# Patient Record
Sex: Male | Born: 1955
Health system: Southern US, Community
[De-identification: ages and names within clinical notes are randomized; demographics above are authoritative.]

## PROBLEM LIST (undated history)

## (undated) DIAGNOSIS — M1990 Unspecified osteoarthritis, unspecified site: Secondary | ICD-10-CM

## (undated) DIAGNOSIS — I10 Essential (primary) hypertension: Secondary | ICD-10-CM

## (undated) DIAGNOSIS — R7303 Prediabetes: Secondary | ICD-10-CM

## (undated) HISTORY — PX: TONSILLECTOMY: SUR1361

## (undated) HISTORY — PX: OTHER SURGICAL HISTORY: SHX169

---

## 2008-05-18 ENCOUNTER — Ambulatory Visit (HOSPITAL_BASED_OUTPATIENT_CLINIC_OR_DEPARTMENT_OTHER): Admission: RE | Admit: 2008-05-18 | Discharge: 2008-05-18 | Payer: Self-pay | Admitting: Family Medicine

## 2008-05-21 ENCOUNTER — Ambulatory Visit: Payer: Self-pay | Admitting: Internal Medicine

## 2011-04-02 ENCOUNTER — Emergency Department (HOSPITAL_BASED_OUTPATIENT_CLINIC_OR_DEPARTMENT_OTHER)
Admission: EM | Admit: 2011-04-02 | Discharge: 2011-04-02 | Disposition: A | Discharge status: Home / Self Care | Payer: 59 | Attending: Emergency Medicine | Admitting: Emergency Medicine

## 2011-04-02 ENCOUNTER — Emergency Department (INDEPENDENT_AMBULATORY_CARE_PROVIDER_SITE_OTHER): Payer: 59

## 2011-04-02 ENCOUNTER — Emergency Department (HOSPITAL_BASED_OUTPATIENT_CLINIC_OR_DEPARTMENT_OTHER): Payer: 59

## 2011-04-02 DIAGNOSIS — W230XXA Caught, crushed, jammed, or pinched between moving objects, initial encounter: Secondary | ICD-10-CM | POA: Insufficient documentation

## 2011-04-02 DIAGNOSIS — IMO0002 Reserved for concepts with insufficient information to code with codable children: Secondary | ICD-10-CM

## 2011-04-02 DIAGNOSIS — Y92009 Unspecified place in unspecified non-institutional (private) residence as the place of occurrence of the external cause: Secondary | ICD-10-CM | POA: Insufficient documentation

## 2011-04-02 DIAGNOSIS — S82109A Unspecified fracture of upper end of unspecified tibia, initial encounter for closed fracture: Secondary | ICD-10-CM

## 2011-04-09 ENCOUNTER — Ambulatory Visit (HOSPITAL_COMMUNITY): Payer: 59

## 2011-04-09 ENCOUNTER — Inpatient Hospital Stay (HOSPITAL_COMMUNITY)
Admission: RE | Admit: 2011-04-09 | Discharge: 2011-04-12 | DRG: 489 | Disposition: A | Discharge status: Home Health | Payer: 59 | Source: Ambulatory Visit | Attending: Orthopedic Surgery | Admitting: Orthopedic Surgery

## 2011-04-09 DIAGNOSIS — Y92009 Unspecified place in unspecified non-institutional (private) residence as the place of occurrence of the external cause: Secondary | ICD-10-CM

## 2011-04-09 DIAGNOSIS — S83289A Other tear of lateral meniscus, current injury, unspecified knee, initial encounter: Secondary | ICD-10-CM | POA: Diagnosis present

## 2011-04-09 DIAGNOSIS — S82109A Unspecified fracture of upper end of unspecified tibia, initial encounter for closed fracture: Principal | ICD-10-CM | POA: Diagnosis present

## 2011-04-09 LAB — URINALYSIS, ROUTINE W REFLEX MICROSCOPIC
Bilirubin Urine: NEGATIVE
Hgb urine dipstick: NEGATIVE
Protein, ur: NEGATIVE mg/dL
Urobilinogen, UA: 1 mg/dL (ref 0.0–1.0)

## 2011-04-09 LAB — COMPREHENSIVE METABOLIC PANEL
CO2: 26 mEq/L (ref 19–32)
Calcium: 9.1 mg/dL (ref 8.4–10.5)
Creatinine, Ser: 0.96 mg/dL (ref 0.4–1.5)
GFR calc Af Amer: >60 mL/min (ref >60)
GFR calc non Af Amer: >60 mL/min (ref >60)
Glucose, Bld: 124 mg/dL — ABNORMAL HIGH (ref 70–99)

## 2011-04-09 LAB — SURGICAL PCR SCREEN: Staphylococcus aureus: NEGATIVE

## 2011-04-09 LAB — APTT: aPTT: 30 seconds (ref 24–37)

## 2011-04-09 LAB — CBC
HCT: 37.1 % — ABNORMAL LOW (ref 39.0–52.0)
MCHC: 35.6 g/dL (ref 30.0–36.0)
RDW: 12.3 % (ref 11.5–15.5)

## 2011-04-09 LAB — PROTIME-INR
INR: 1.04 (ref 0.00–1.49)
Prothrombin Time: 13.8 seconds (ref 11.6–15.2)

## 2011-04-11 LAB — PROTIME-INR
INR: 2.14 — ABNORMAL HIGH (ref 0.00–1.49)
Prothrombin Time: 24.1 seconds — ABNORMAL HIGH (ref 11.6–15.2)

## 2011-04-11 LAB — HEMOGLOBIN AND HEMATOCRIT, BLOOD: Hemoglobin: 12.8 g/dL — ABNORMAL LOW (ref 13.0–17.0)

## 2011-04-11 NOTE — Op Note (Signed)
Todd Herrera, Todd Herrera                ACCOUNT NO.:  [id number]  MEDICAL RECORD NO.:  [id number]           PATIENT TYPE:  O  LOCATION:  DAYL                         FACILITY:  [facility]  PHYSICIAN:  Georges Lynch. Retta Pitcher, M.D.DATE OF BIRTH:  12/13/1956  DATE OF PROCEDURE:  04/09/2011 DATE OF DISCHARGE:                              OPERATIVE REPORT   SURGEON:  Georges Lynch. Darrelyn Hillock, M.D.  ASSISTANT:  Rozell Searing, [facility].  PREOPERATIVE DIAGNOSIS:  Severe closed depressed lateral tibial plateau fracture of the right knee with disruption of his lateral meniscus.  POSTOPERATIVE DIAGNOSIS:  Severe closed depressed lateral tibial plateau fracture of the right knee with disruption of his lateral meniscus.  OPERATION: 1. Open reduction, internal plate fixation of a complex comminuted     lateral tibial plateau fracture. 2. Repair of the lateral meniscus. 3. Bone graft utilizing ETEX bone graft material.  We utilized about     10 cc of that.  The plate used was a DePuy locking plate, standard     3 hole. 4. Arthrotomy, right knee.  PROCEDURE:  Under general anesthesia, routine orthopedic prepping and draping of the right lower extremity was carried out.  The appropriate time-out was carried out prior to making incision.  Prior to that, in the holding area, I marked the appropriate right leg.  At that time, leg was exsanguinated with an Esmarch.  Tourniquet was elevated at 325 mmHg. A hockey-stick type incision was made over the lateral plateau region. A flap was created with the subcutaneous tissue and reflected.  At this time, I incised the iliotibial band, stripped that from the lateral tibia and went into the joint.  We did an arthrotomy.  I then went in and noticed the meniscus was detached.  I had to tag that for future repair.  Following that, there was a large fragment laterally off the lateral tibial plateau.  I went in with the curette, curetted out an area of cancellus bone and then  utilized a Cobb elevator and elevated the plateau to its normal level.  We had a nice reduction.  Following that, I packed the subchondral area with ETEX bone graft material 10 cc. I then reapproximated the large flap of tibial condyle.  Following that, I then applied my standard 3-hole plate in the usual fashion.  The plate was held in place with a guide wire.  Note, initial guide wire was placed in the bone in this area mainly for guidance purposes and to make sure we had a complete parallel plane to the joint.  Following that, the appropriate drill holes were made in the plate.  We utilized locking screws in usual fashion to affix the plate to the tibia and to the condyle of the tibia.  Following that, I thoroughly irrigated out the area.  We had a nice reduction of the plateau.  I then repaired the lateral meniscus and closed the wound layers in usual fashion. Skin was closed with metal staples.  I injected 30 cc of 0.25% Marcaine into the knee and sterile Neosporin dressing was applied.  The patient had 2 grams IV Ancef  preop.  Postop, he will be maintained on the Coumadin and heparin protocol.          ______________________________ Georges Lynch. Darrelyn Hillock, M.D.     RAG/MEDQ  D:  04/09/2011  T:  04/09/2011  Job:  [id number]  Electronically Signed by Ranee Gosselin M.D. on 04/11/2011 08:06:37 AM

## 2011-04-12 LAB — PROTIME-INR
INR: 1.84 — ABNORMAL HIGH (ref 0.00–1.49)
Prothrombin Time: 21.4 seconds — ABNORMAL HIGH (ref 11.6–15.2)

## 2011-05-07 NOTE — Procedures (Signed)
Todd Herrera, Todd Herrera                ACCOUNT NO.:  [id number]   MEDICAL RECORD NO.:  [id number]          PATIENT TYPE:  OUT   LOCATION:  SLEEP CENTER                 FACILITY:  [facility]   PHYSICIAN:  Clinton D. Maple Hudson, MD, FCCP, FACPDATE OF BIRTH:  05-25-1956   DATE OF STUDY:  05/18/2008                            NOCTURNAL POLYSOMNOGRAM   REFERRING PHYSICIAN:  Rafaela M. Aguiar   INDICATION FOR STUDY:  Hypersomnia with sleep apnea.   EPWORTH SLEEPINESS SCORE:  8/24.  BMI 27.6, weight 195 pounds, height  70.5 inches, neck 17 inches.   MEDICATIONS:  No home medications were listed.   SLEEP ARCHITECTURE:  Total sleep time 256 minutes with sleep efficiency  60.4%.  Stage I was 5.3%, stage II 76%, stage III absent, REM 18.1% of  total sleep time.  Sleep latency 15 minutes, REM latency 234 minutes.  Awake after sleep onset 153 minutes, arousal index 16.8.  No bedtime  medication was taken.  Brief intervals of sleep were recorded at 10:30  p.m. and at midnight, but sustained sleep was not achieved until 1:45  a.m.   RESPIRATORY DATA:  Apnea/hypopnea index (AHI) 3.7 per hour, respiratory  disturbance index (RDI) 11.5 per hour.  The AHI is within normal.  The  RDI would indicate mild obstructive sleep apnea/hypopnea syndrome.  A  total of 16 events were scored, including 11 obstructive apneas and 5  hypopneas.  They were insufficient effects to permit CPAP titration by  split-protocol.  Most events were associated with supine sleep position  while in REM with REM AHI 18.1 per hour.   OXYGEN DATA:  Moderately loud snoring with oxygen desaturation to a  nadir of 91%.  Mean oxygen saturation through the study 97% on room air.   CARDIAC DATA:  Normal sinus rhythm.   MOVEMENT-PARASOMNIA:  No significant movement disturbance.  Bathroom x1.   IMPRESSIONS-RECOMMENDATIONS:  1. Mild obstructive sleep apnea/hypopnea syndrome, AHI 3.7 per hour,      RDI 11.5 per hour.  Most events were recorded  while supine in REM      as expected.  Moderately loud snoring with oxygen desaturation to a      nadir of 91%.  2. He had insufficient events to qualify for CPAP titration by split-      protocol and sleep onset was delayed.  Based      on his RDI, he could qualify for a trial of CPAP titration if      clinically appropriate.  Consider return for CPAP titration or      evaluate for alternative therapies as indicated.      Clinton D. Maple Hudson, MD, [facility], FACP  Diplomate, Biomedical engineer of Sleep Medicine  Electronically Signed     CDY/MEDQ  D:  05/21/2008 13:50:26  T:  05/21/2008 14:16:50  Job:  [id number]

## 2011-06-10 ENCOUNTER — Ambulatory Visit: Payer: 59 | Attending: Orthopedic Surgery | Admitting: Physical Therapy

## 2011-06-10 DIAGNOSIS — M25569 Pain in unspecified knee: Secondary | ICD-10-CM | POA: Insufficient documentation

## 2011-06-10 DIAGNOSIS — R269 Unspecified abnormalities of gait and mobility: Secondary | ICD-10-CM | POA: Insufficient documentation

## 2011-06-10 DIAGNOSIS — R262 Difficulty in walking, not elsewhere classified: Secondary | ICD-10-CM | POA: Insufficient documentation

## 2011-06-10 DIAGNOSIS — IMO0001 Reserved for inherently not codable concepts without codable children: Secondary | ICD-10-CM | POA: Insufficient documentation

## 2011-06-19 ENCOUNTER — Ambulatory Visit: Payer: 59 | Admitting: Rehabilitation

## 2011-06-20 ENCOUNTER — Encounter: Payer: 59 | Admitting: Rehabilitation

## 2011-06-25 ENCOUNTER — Ambulatory Visit: Payer: 59 | Attending: Diagnostic Radiology | Admitting: Rehabilitation

## 2011-06-25 DIAGNOSIS — IMO0001 Reserved for inherently not codable concepts without codable children: Secondary | ICD-10-CM | POA: Insufficient documentation

## 2011-06-25 DIAGNOSIS — M25569 Pain in unspecified knee: Secondary | ICD-10-CM | POA: Insufficient documentation

## 2011-06-25 DIAGNOSIS — R269 Unspecified abnormalities of gait and mobility: Secondary | ICD-10-CM | POA: Insufficient documentation

## 2011-06-25 DIAGNOSIS — R262 Difficulty in walking, not elsewhere classified: Secondary | ICD-10-CM | POA: Insufficient documentation

## 2011-06-27 ENCOUNTER — Ambulatory Visit: Payer: 59 | Admitting: Rehabilitation

## 2011-07-10 ENCOUNTER — Ambulatory Visit: Payer: 59 | Admitting: Rehabilitation

## 2011-07-17 ENCOUNTER — Ambulatory Visit: Payer: 59 | Admitting: Physical Therapy

## 2011-07-18 ENCOUNTER — Ambulatory Visit: Payer: 59 | Admitting: Physical Therapy

## 2011-07-22 ENCOUNTER — Ambulatory Visit: Payer: 59 | Admitting: Rehabilitation

## 2011-07-24 ENCOUNTER — Ambulatory Visit: Payer: 59 | Attending: Orthopedic Surgery | Admitting: Rehabilitation

## 2011-07-24 DIAGNOSIS — M25569 Pain in unspecified knee: Secondary | ICD-10-CM | POA: Insufficient documentation

## 2011-07-24 DIAGNOSIS — R262 Difficulty in walking, not elsewhere classified: Secondary | ICD-10-CM | POA: Insufficient documentation

## 2011-07-24 DIAGNOSIS — R269 Unspecified abnormalities of gait and mobility: Secondary | ICD-10-CM | POA: Insufficient documentation

## 2011-07-24 DIAGNOSIS — IMO0001 Reserved for inherently not codable concepts without codable children: Secondary | ICD-10-CM | POA: Insufficient documentation

## 2011-07-30 ENCOUNTER — Ambulatory Visit: Payer: 59 | Admitting: Rehabilitation

## 2011-08-01 ENCOUNTER — Ambulatory Visit: Payer: 59 | Admitting: Rehabilitation

## 2011-08-06 ENCOUNTER — Ambulatory Visit: Payer: 59 | Admitting: Rehabilitation

## 2011-08-08 ENCOUNTER — Ambulatory Visit: Payer: 59 | Admitting: Rehabilitation

## 2011-08-12 ENCOUNTER — Ambulatory Visit: Payer: 59 | Admitting: Rehabilitation

## 2011-08-14 ENCOUNTER — Ambulatory Visit: Payer: 59 | Admitting: Rehabilitation

## 2011-08-21 ENCOUNTER — Ambulatory Visit: Payer: 59 | Admitting: Rehabilitation

## 2011-08-27 ENCOUNTER — Ambulatory Visit: Payer: 59 | Attending: Orthopedic Surgery | Admitting: Rehabilitation

## 2011-08-27 DIAGNOSIS — M25569 Pain in unspecified knee: Secondary | ICD-10-CM | POA: Insufficient documentation

## 2011-08-27 DIAGNOSIS — IMO0001 Reserved for inherently not codable concepts without codable children: Secondary | ICD-10-CM | POA: Insufficient documentation

## 2011-08-27 DIAGNOSIS — R262 Difficulty in walking, not elsewhere classified: Secondary | ICD-10-CM | POA: Insufficient documentation

## 2011-08-27 DIAGNOSIS — R269 Unspecified abnormalities of gait and mobility: Secondary | ICD-10-CM | POA: Insufficient documentation

## 2011-08-29 ENCOUNTER — Ambulatory Visit: Payer: 59 | Admitting: Rehabilitation

## 2011-09-03 ENCOUNTER — Ambulatory Visit: Payer: 59 | Admitting: Rehabilitation

## 2011-09-10 ENCOUNTER — Encounter: Payer: 59 | Admitting: Physical Therapy

## 2011-09-12 ENCOUNTER — Ambulatory Visit: Payer: 59 | Admitting: Physical Therapy

## 2011-09-17 ENCOUNTER — Ambulatory Visit: Payer: 59 | Admitting: Physical Therapy

## 2011-09-19 ENCOUNTER — Ambulatory Visit: Payer: 59 | Admitting: Physical Therapy

## 2011-10-02 ENCOUNTER — Ambulatory Visit: Payer: 59 | Admitting: Physical Therapy

## 2011-10-08 ENCOUNTER — Ambulatory Visit: Payer: 59 | Attending: Orthopedic Surgery | Admitting: Physical Therapy

## 2011-10-08 DIAGNOSIS — R262 Difficulty in walking, not elsewhere classified: Secondary | ICD-10-CM | POA: Insufficient documentation

## 2011-10-08 DIAGNOSIS — IMO0001 Reserved for inherently not codable concepts without codable children: Secondary | ICD-10-CM | POA: Insufficient documentation

## 2011-10-08 DIAGNOSIS — M25569 Pain in unspecified knee: Secondary | ICD-10-CM | POA: Insufficient documentation

## 2011-10-08 DIAGNOSIS — R269 Unspecified abnormalities of gait and mobility: Secondary | ICD-10-CM | POA: Insufficient documentation

## 2013-11-04 ENCOUNTER — Emergency Department (HOSPITAL_BASED_OUTPATIENT_CLINIC_OR_DEPARTMENT_OTHER)
Admission: EM | Admit: 2013-11-04 | Discharge: 2013-11-04 | Disposition: A | Discharge status: Home / Self Care | Payer: 59 | Attending: Emergency Medicine | Admitting: Emergency Medicine

## 2013-11-04 ENCOUNTER — Encounter (HOSPITAL_BASED_OUTPATIENT_CLINIC_OR_DEPARTMENT_OTHER): Payer: Self-pay | Admitting: Emergency Medicine

## 2013-11-04 ENCOUNTER — Emergency Department (HOSPITAL_BASED_OUTPATIENT_CLINIC_OR_DEPARTMENT_OTHER): Payer: 59

## 2013-11-04 DIAGNOSIS — R519 Headache, unspecified: Secondary | ICD-10-CM

## 2013-11-04 DIAGNOSIS — R51 Headache: Secondary | ICD-10-CM | POA: Insufficient documentation

## 2013-11-04 NOTE — ED Provider Notes (Signed)
CSN: [id number]     Arrival date & time 11/04/13  1200 History   First MD Initiated Contact with Patient 11/04/13 1220     Chief Complaint  Patient presents with  . Headache   (Consider location/radiation/quality/duration/timing/severity/associated sxs/prior Treatment) HPI Comments: Patient is a 57 year old otherwise healthy male presents to the emergency department with complaints of headache. He states that this started 5 days ago and gradually worsened. He had been taking some aspirin with some relief. He called his primary Dr. and was told to come here for evaluation and scan of his head. Patient tells me he is actually feeling better and his headache is gone. He is concerned that he may have an aneurysm.  Patient is a 57 y.o. male presenting with headaches. The history is provided by the patient.  Headache Pain location:  Frontal Quality:  Stabbing Radiates to:  Does not radiate Onset quality:  Gradual Duration:  5 days Timing:  Constant Progression:  Resolved Chronicity:  New Context: caffeine   Context: not activity and not exposure to bright light   Relieved by:  Nothing Worsened by:  Nothing tried Ineffective treatments:  None tried   History reviewed. No pertinent past medical history. Past Surgical History  Procedure Laterality Date  . Leg surgery     No family history on file. History  Substance Use Topics  . Smoking status: Never Smoker   . Smokeless tobacco: Not on file  . Alcohol Use: Yes    Review of Systems  Neurological: Positive for headaches.  All other systems reviewed and are negative.    Allergies  Review of patient's allergies indicates no known allergies.  Home Medications  No current outpatient prescriptions on file. BP 131/82  Pulse 64  Temp(Src) 98.5 F (36.9 C) (Oral)  Resp 16  Ht 5' 10.5" (1.791 m)  Wt 215 lb (97.523 kg)  BMI 30.40 kg/m2  SpO2 100% Physical Exam  Nursing note and vitals reviewed. Constitutional: He is  oriented to person, place, and time. He appears well-developed and well-nourished. No distress.  HENT:  Head: Normocephalic and atraumatic.  Mouth/Throat: Oropharynx is clear and moist.  Eyes: EOM are normal. Pupils are equal, round, and reactive to light.  Neck: Normal range of motion. Neck supple.  Cardiovascular: Normal rate, regular rhythm and normal heart sounds.   No murmur heard. Pulmonary/Chest: Effort normal and breath sounds normal. No respiratory distress. He has no wheezes.  Abdominal: Soft. Bowel sounds are normal. He exhibits no distension. There is no tenderness.  Musculoskeletal: Normal range of motion. He exhibits no edema.  Neurological: He is alert and oriented to person, place, and time. No cranial nerve deficit. He exhibits normal muscle tone. Coordination normal.  Skin: Skin is warm and dry. He is not diaphoretic.    ED Course  Procedures (including critical care time) Labs Review Labs Reviewed - No data to display Imaging Review No results found.    MDM  No diagnosis found. Patient is a 57 year old male presents to the ER with complaints of headache. States he has been under a lot of stress lately and has been drinking an excessive amount of caffeine. His physical exam is unremarkable. CT scan of the head is negative. He is actually feeling better and wants to go home. He will be discharged with instructions to take Tylenol or ibuprofen if the symptoms recur and followup with his primary Dr. if not improving in the next week.    Geoffery Lyons, MD 11/04/13 1340

## 2013-11-04 NOTE — ED Notes (Signed)
Intermittent HA x 1 week-has been relieved with ASA over the week-HA has increased in freq and intensity and now c/o bilat leg pain-pt with quick steady gait to ED-pt was seen by [facility] PCP 5 days ago-"suggested i go to the emergency room"

## 2013-11-04 NOTE — ED Notes (Signed)
MD at bedside. 

## 2016-01-05 DIAGNOSIS — B354 Tinea corporis: Secondary | ICD-10-CM | POA: Diagnosis not present

## 2016-01-05 DIAGNOSIS — Z79899 Other long term (current) drug therapy: Secondary | ICD-10-CM | POA: Diagnosis not present

## 2016-01-05 DIAGNOSIS — B350 Tinea barbae and tinea capitis: Secondary | ICD-10-CM | POA: Diagnosis not present

## 2016-01-05 MED FILL — KETOCONAZOLE 2% SHAMPOO: 2 | 7 days supply | Qty: 120 | Fill #0

## 2016-01-08 MED FILL — TERBINAFINE HCL 250 MG TAB: 250 | 42 days supply | Qty: 42 | Fill #0

## 2016-01-23 DIAGNOSIS — Z0000 Encounter for general adult medical examination without abnormal findings: Secondary | ICD-10-CM | POA: Diagnosis not present

## 2016-02-15 MED FILL — AMOXICILLIN 875 MG TABLET: 875 | 7 days supply | Qty: 14 | Fill #0

## 2016-02-15 MED FILL — IBUPROFEN 600 MG TABLET: 600 | 5 days supply | Qty: 20 | Fill #0

## 2016-02-22 MED FILL — CHLORHEXIDINE 0.12% RINSE: 0.12 | 30 days supply | Qty: 946 | Fill #0

## 2017-04-29 DIAGNOSIS — M62838 Other muscle spasm: Secondary | ICD-10-CM | POA: Diagnosis not present

## 2017-04-29 DIAGNOSIS — M542 Cervicalgia: Secondary | ICD-10-CM | POA: Diagnosis not present

## 2017-04-29 MED FILL — CYCLOBENZAPRINE 10 MG TAB: 10 | 10 days supply | Qty: 10 | Fill #0

## 2017-06-27 DIAGNOSIS — Z0000 Encounter for general adult medical examination without abnormal findings: Secondary | ICD-10-CM | POA: Diagnosis not present

## 2017-06-27 DIAGNOSIS — Z2821 Immunization not carried out because of patient refusal: Secondary | ICD-10-CM | POA: Diagnosis not present

## 2017-06-27 DIAGNOSIS — J3489 Other specified disorders of nose and nasal sinuses: Secondary | ICD-10-CM | POA: Diagnosis not present

## 2018-08-13 DIAGNOSIS — Z0000 Encounter for general adult medical examination without abnormal findings: Secondary | ICD-10-CM | POA: Diagnosis not present

## 2019-03-26 DIAGNOSIS — S39012A Strain of muscle, fascia and tendon of lower back, initial encounter: Secondary | ICD-10-CM | POA: Diagnosis not present

## 2019-03-26 MED FILL — CYCLOBENZAPRINE HCL 5 MG TA: 5 | 5 days supply | Qty: 30 | Fill #0

## 2019-03-26 MED FILL — DICLOFENAC SODIUM 75 MG TAB: 75 | 14 days supply | Qty: 28 | Fill #0

## 2020-07-03 DIAGNOSIS — J028 Acute pharyngitis due to other specified organisms: Secondary | ICD-10-CM | POA: Diagnosis not present

## 2020-07-03 MED FILL — predniSONE 5 MG/5ML SOLN: 5 | 3 days supply | Qty: 120 | Fill #0 | Status: TO

## 2020-07-03 MED FILL — AMOX TR-K CLV 400-57/5 SUSP: 400-57 | 10 days supply | Qty: 200 | Fill #0

## 2020-07-04 DIAGNOSIS — Z20822 Contact with and (suspected) exposure to covid-19: Secondary | ICD-10-CM | POA: Diagnosis not present

## 2020-07-04 MED FILL — MAGIC MW BOP W/LIDO 1:1: 5 days supply | Qty: 240 | Fill #0

## 2020-07-05 ENCOUNTER — Emergency Department (HOSPITAL_BASED_OUTPATIENT_CLINIC_OR_DEPARTMENT_OTHER)
Admission: EM | Admit: 2020-07-05 | Discharge: 2020-07-05 | Disposition: A | Discharge status: Home / Self Care | Payer: 59 | Attending: Emergency Medicine | Admitting: Emergency Medicine

## 2020-07-05 ENCOUNTER — Encounter (HOSPITAL_BASED_OUTPATIENT_CLINIC_OR_DEPARTMENT_OTHER): Payer: Self-pay | Admitting: Emergency Medicine

## 2020-07-05 ENCOUNTER — Emergency Department (HOSPITAL_BASED_OUTPATIENT_CLINIC_OR_DEPARTMENT_OTHER): Payer: 59

## 2020-07-05 ENCOUNTER — Other Ambulatory Visit: Payer: Self-pay

## 2020-07-05 DIAGNOSIS — J36 Peritonsillar abscess: Secondary | ICD-10-CM | POA: Insufficient documentation

## 2020-07-05 DIAGNOSIS — R070 Pain in throat: Secondary | ICD-10-CM | POA: Diagnosis present

## 2020-07-05 LAB — CBC WITH DIFFERENTIAL/PLATELET
Abs Immature Granulocytes: 0.06 10*3/uL (ref 0.00–0.07)
Basophils Absolute: 0.1 10*3/uL (ref 0.0–0.1)
Basophils Relative: 0 %
Eosinophils Absolute: 0 10*3/uL (ref 0.0–0.5)
Eosinophils Relative: 0 %
HCT: 45.2 % (ref 39.0–52.0)
Hemoglobin: 15.9 g/dL (ref 13.0–17.0)
Immature Granulocytes: 0 %
Lymphocytes Relative: 13 %
Lymphs Abs: 2 10*3/uL (ref 0.7–4.0)
MCH: 33.9 pg (ref 26.0–34.0)
MCHC: 35.2 g/dL (ref 30.0–36.0)
MCV: 96.4 fL (ref 80.0–100.0)
Monocytes Absolute: 1.5 10*3/uL — ABNORMAL HIGH (ref 0.1–1.0)
Monocytes Relative: 10 %
Neutro Abs: 12 10*3/uL — ABNORMAL HIGH (ref 1.7–7.7)
Neutrophils Relative %: 77 %
Platelets: 243 10*3/uL (ref 150–400)
RBC: 4.69 MIL/uL (ref 4.22–5.81)
RDW: 12.7 % (ref 11.5–15.5)
WBC: 15.6 10*3/uL — ABNORMAL HIGH (ref 4.0–10.5)
nRBC: 0 % (ref 0.0–0.2)

## 2020-07-05 LAB — BASIC METABOLIC PANEL
Anion gap: 12 (ref 5–15)
BUN: 19 mg/dL (ref 8–23)
CO2: 26 mmol/L (ref 22–32)
Calcium: 8.7 mg/dL — ABNORMAL LOW (ref 8.9–10.3)
Chloride: 101 mmol/L (ref 98–111)
Creatinine, Ser: 1.11 mg/dL (ref 0.61–1.24)
GFR calc Af Amer: >60 mL/min (ref >60)
GFR calc non Af Amer: >60 mL/min (ref >60)
Glucose, Bld: 115 mg/dL — ABNORMAL HIGH (ref 70–99)
Potassium: 3.7 mmol/L (ref 3.5–5.1)
Sodium: 139 mmol/L (ref 135–145)

## 2020-07-05 MED ORDER — IOHEXOL 300 MG/ML  SOLN
75.0000 mL | Freq: Once | INTRAMUSCULAR | Status: AC | PRN
  Administered 2020-07-05: 75 mL via INTRAVENOUS

## 2020-07-05 MED ORDER — LIDOCAINE VISCOUS HCL 2 % MT SOLN
15.0000 mL | OROMUCOSAL | 0 refills | Status: DC | PRN
Start: 2020-07-05 — End: 2021-10-24

## 2020-07-05 MED ORDER — FENTANYL CITRATE (PF) 100 MCG/2ML IJ SOLN
50.0000 ug | Freq: Once | INTRAMUSCULAR | Status: AC
  Administered 2020-07-05: 50 ug via INTRAVENOUS
  Filled 2020-07-05: qty 2

## 2020-07-05 MED ORDER — LIDOCAINE VISCOUS HCL 2 % MT SOLN
15.0000 mL | Freq: Once | OROMUCOSAL | Status: AC
  Administered 2020-07-05: 15 mL via OROMUCOSAL
  Filled 2020-07-05: qty 15

## 2020-07-05 NOTE — ED Triage Notes (Signed)
Patient states decreased PO intake; states intermittently spitting oral secretions. States last dose of OTC meds was tylenol at  2300

## 2020-07-05 NOTE — ED Triage Notes (Signed)
Patient presents with complaints of sore throat; states onset 3 days ago; states saw pcp 2 days ago and dx with strep throat and given steroids and antibiotics; states no improvement.

## 2020-07-05 NOTE — ED Provider Notes (Signed)
[facility] EMERGENCY DEPARTMENT Provider Note  CSN: [id number] Arrival date & time: 07/05/20 0038  Chief Complaint(s) Sore Throat  HPI Todd Herrera is a 64 y.o. male   CC: sore throat  Onset/Duration: gradual for 3 days Timing: constant, worsening Location: left  Quality: sore Severity: severe Modifying Factors:  Improved by: nothing  Worsened by: swallowing Associated Signs/Symptoms:  Pertinent (+): odynophagia  Pertinent (-): fever, chills, emesis, SOB Context: was seen at PCP's office and had negative rapid strep and covid. Given Amox and prednisone.   HPI  Past Medical History History reviewed. No pertinent past medical history. There are no problems to display for this patient.  Home Medication(s) Prior to Admission medications   Medication Sig Start Date End Date Taking? Authorizing Provider  amoxicillin-clavulanate (AUGMENTIN) 400-57 MG/5ML suspension 10 ml, po, bid for 7 days 07/03/20  Yes [provider]  lidocaine (XYLOCAINE) 2 % solution Mix equal parts of Clindamycin Oral 75mg  per 5cc/Mycostatin Oral/Benadryl Elixir/Carafate Liquid/Prednisolone 15mg /5cc; 70ml Swish and Expectorate Q2H prn 07/04/20  Yes [provider]  predniSONE 5 MG/5ML solution Take by mouth. 07/03/20 07/06/20 Yes [provider]                                                                                                                                    Past Surgical History Past Surgical History:  Procedure Laterality Date  . leg surgery     Family History History reviewed. No pertinent family history.  Social History Social History   Tobacco Use  . Smoking status: Never Smoker  . Smokeless tobacco: Never Used  Substance Use Topics  . Alcohol use: Yes  . Drug use: No   Allergies Patient has no known allergies.  Review of Systems Review of Systems All other systems are reviewed and are negative for acute change except as noted in  the HPI  Physical Exam Vital Signs  I have reviewed the triage vital signs BP 126/80 (BP Location: Left Arm)   Pulse 89   Temp 99.1 F (37.3 C) (Oral)   Resp 18   Ht 5' 10.5" (1.791 m)   Wt 97.5 kg   SpO2 98%   BMI 30.41 kg/m   Physical Exam Vitals reviewed.  Constitutional:      General: He is not in acute distress.    Appearance: He is well-developed. He is not diaphoretic.  HENT:     Head: Normocephalic and atraumatic.     Jaw: No trismus.     Right Ear: External ear normal.     Left Ear: External ear normal.     Nose: Nose normal.     Mouth/Throat:     Pharynx: Posterior oropharyngeal erythema (mild) present. No oropharyngeal exudate or uvula swelling.     Tonsils: No tonsillar exudate. 1+ on the right. 2+ on the left.  Eyes:     General: No scleral icterus.  Conjunctiva/sclera: Conjunctivae normal.  Neck:     Trachea: Phonation normal.  Cardiovascular:     Rate and Rhythm: Normal rate and regular rhythm.  Pulmonary:     Effort: Pulmonary effort is normal. No respiratory distress.     Breath sounds: No stridor.  Abdominal:     General: There is no distension.  Musculoskeletal:        General: Normal range of motion.     Cervical back: Normal range of motion.  Lymphadenopathy:     Head:     Left side of head: Submental, submandibular and tonsillar adenopathy present. No preauricular, posterior auricular or occipital adenopathy.     Cervical: Cervical adenopathy present.     Left cervical: Superficial cervical adenopathy and deep cervical adenopathy present.  Neurological:     Mental Status: He is alert and oriented to person, place, and time.  Psychiatric:        Behavior: Behavior normal.     ED Results and Treatments Labs (all labs ordered are listed, but only abnormal results are displayed) Labs Reviewed  CBC WITH DIFFERENTIAL/PLATELET - Abnormal; Notable for the following components:      Result Value   WBC 15.6 (*)    Neutro Abs 12.0 (*)     Monocytes Absolute 1.5 (*)    All other components within normal limits  BASIC METABOLIC PANEL - Abnormal; Notable for the following components:   Glucose, Bld 115 (*)    Calcium 8.7 (*)    All other components within normal limits                                                                                                                         EKG  EKG Interpretation  Date/Time:    Ventricular Rate:    PR Interval:    QRS Duration:   QT Interval:    QTC Calculation:   R Axis:     Text Interpretation:        Radiology CT Soft Tissue Neck W Contrast  Result Date: 07/05/2020 CLINICAL DATA:  Epiglottitis or tonsillitis suspected. Sore throat for 3 days EXAM: CT NECK WITH CONTRAST TECHNIQUE: Multidetector CT imaging of the neck was performed using the standard protocol following the bolus administration of intravenous contrast. CONTRAST:  73mL OMNIPAQUE IOHEXOL 300 MG/ML  SOLN COMPARISON:  None. FINDINGS: Pharynx and larynx: 18 mm left peritonsillar collection with regional swelling. There is pooling of secretions in the hypopharynx. Salivary glands: Negative Thyroid: Normal Lymph nodes: Adenitis appearance along the left jugular chain, non cavitated and overall mild. Vascular: Negative.  No venous thrombosis. Limited intracranial: Negative Visualized orbits: Negative Mastoids and visualized paranasal sinuses: Clear sinuses. Nasal septum perforation with smooth margins. Skeleton: Spondylosis.  No impingement Upper chest: Negative IMPRESSION: 18 mm left peritonsillar abscess. Electronically Signed   By: Marnee Spring M.D.   On: 07/05/2020 05:00    Pertinent labs & imaging results that were available during my care of the patient  were reviewed by me and considered in my medical decision making (see chart for details).  Medications Ordered in ED Medications  lidocaine (XYLOCAINE) 2 % viscous mouth solution 15 mL (has no administration in time range)  iohexol (OMNIPAQUE) 300 MG/ML  solution 75 mL (75 mLs Intravenous Contrast Given 07/05/20 0423)  fentaNYL (SUBLIMAZE) injection 50 mcg (50 mcg Intravenous Given 07/05/20 0421)                                                                                                                                    Procedures Procedures  (including critical care time)  Medical Decision Making / ED Course I have reviewed the nursing notes for this encounter and the patient's prior records (if available in EHR or on provided paperwork).   ALTAIR APPENZELLER was evaluated in Emergency Department on 07/05/2020 for the symptoms described in the history of present illness. He was evaluated in the context of the global COVID-19 pandemic, which necessitated consideration that the patient might be at risk for infection with the SARS-CoV-2 virus that causes COVID-19. Institutional protocols and algorithms that pertain to the evaluation of patients at risk for COVID-19 are in a state of rapid change based on information released by regulatory bodies including the CDC and federal and state organizations. These policies and algorithms were followed during the patient's care in the ED.  peritonsillar abscess. Currently on day 2 of Abx. Tolerating secretions. No airway compromise. Viscous lido for pain.  Continue Abx. ENT f/u as needed      Final Clinical Impression(s) / ED Diagnoses Final diagnoses:  Peritonsillar abscess    The patient appears reasonably screened and/or stabilized for discharge and I doubt any other medical condition or other [facility] requiring further screening, evaluation, or treatment in the ED at this time prior to discharge. Safe for discharge with strict return precautions.  Disposition: Discharge  Condition: Good  I have discussed the results, Dx and Tx plan with the patient/family who expressed understanding and agree(s) with the plan. Discharge instructions discussed at length. The patient/family was given strict  return precautions who verbalized understanding of the instructions. No further questions at time of discharge.    ED Discharge Orders    None      Follow Up: Gillian Scarce [address] [place] [address] [phone]  Schedule an appointment as soon as possible for a visit  As needed  Drema Halon, MD [address] [place] [address] [phone]   in 3-5 days, If symptoms do not improve or  worsen     This chart was dictated using voice recognition software.  Despite best efforts to proofread,  errors can occur which can change the documentation meaning.   Nira Conn, MD 07/05/20 [phone]

## 2020-07-06 ENCOUNTER — Telehealth: Payer: Self-pay | Admitting: *Deleted

## 2020-07-06 ENCOUNTER — Emergency Department (HOSPITAL_COMMUNITY)
Admission: EM | Admit: 2020-07-06 | Discharge: 2020-07-06 | Disposition: A | Discharge status: Home / Self Care | Payer: 59 | Attending: Emergency Medicine | Admitting: Emergency Medicine

## 2020-07-06 ENCOUNTER — Other Ambulatory Visit: Payer: Self-pay

## 2020-07-06 DIAGNOSIS — J36 Peritonsillar abscess: Secondary | ICD-10-CM | POA: Insufficient documentation

## 2020-07-06 MED ORDER — PREDNISONE 10 MG (21) PO TBPK
ORAL_TABLET | ORAL | 0 refills | Status: DC
Start: 2020-07-06 — End: 2021-10-17

## 2020-07-06 MED ORDER — HYDROCODONE-ACETAMINOPHEN 7.5-325 MG/15ML PO SOLN: 10.0000 mL | ORAL | 0 refills | Status: DC | PRN

## 2020-07-06 MED ORDER — FENTANYL CITRATE (PF) 100 MCG/2ML IJ SOLN
100.0000 ug | Freq: Once | INTRAMUSCULAR | Status: AC
  Administered 2020-07-06: 100 ug via INTRAVENOUS
  Filled 2020-07-06: qty 2

## 2020-07-06 MED ORDER — SODIUM CHLORIDE 0.9 % IV BOLUS
1000.0000 mL | Freq: Once | INTRAVENOUS | Status: AC
  Administered 2020-07-06: 1000 mL via INTRAVENOUS

## 2020-07-06 MED ORDER — CLINDAMYCIN HCL 300 MG PO CAPS
300.0000 mg | ORAL_CAPSULE | Freq: Four times a day (QID) | ORAL | 0 refills | Status: AC
Start: 2020-07-06 — End: 2020-07-16

## 2020-07-06 MED ORDER — CLINDAMYCIN PHOSPHATE 900 MG/50ML IV SOLN
900.0000 mg | Freq: Once | INTRAVENOUS | Status: AC
  Administered 2020-07-06: 900 mg via INTRAVENOUS
  Filled 2020-07-06: qty 50

## 2020-07-06 MED ORDER — LIDOCAINE-EPINEPHRINE (PF) 2 %-1:200000 IJ SOLN
20.0000 mL | Freq: Once | INTRAMUSCULAR | Status: AC
  Administered 2020-07-06: 20 mL
  Filled 2020-07-06: qty 20

## 2020-07-06 MED ORDER — DEXAMETHASONE SODIUM PHOSPHATE 10 MG/ML IJ SOLN
20.0000 mg | Freq: Once | INTRAMUSCULAR | Status: AC
  Administered 2020-07-06: 20 mg via INTRAVENOUS
  Filled 2020-07-06: qty 2

## 2020-07-06 NOTE — ED Triage Notes (Signed)
Patient reports he has a peritonsillar abscess that he reports he has been taking antibiotics x4 days. Patient reports it needs to be drained.

## 2020-07-06 NOTE — Consult Note (Signed)
Reason for Consult: Left peritonsillar abscess Referring Physician: Pricilla Loveless, MD  HPI:  Todd Herrera is an 64 y.o. male who presents to the [facility] emergency room today complaining of severe left-sided throat pain.  His throat pain started 4 days ago.  He was evaluated at an urgent care center, and was prescribed amoxicillin.  However, his sore throat worsened.  He was seen and at a hospital emergency room 2 nights ago.  His CT scan at that time showed a 1.8 cm left peritonsillar abscess.  However, he was not able to get an appointment to see the assigned ENT.  He returns to the [facility] long emergency room today complaining of severe left-sided throat pain, dysphagia, and odynophagia.  He has no breathing difficulty.  No past medical history on file.  Past Surgical History:  Procedure Laterality Date  . leg surgery      No family history on file.  Social History:  reports that he has never smoked. He has never used smokeless tobacco. He reports current alcohol use. He reports that he does not use drugs.  Allergies: No Known Allergies  Prior to Admission medications   Medication Sig Start Date End Date Taking? Authorizing Provider  amoxicillin-clavulanate (AUGMENTIN) 400-57 MG/5ML suspension 10 ml, po, bid for 7 days 07/03/20   [provider]  lidocaine (XYLOCAINE) 2 % solution Use as directed 15 mLs in the mouth or throat as needed for mouth pain. 07/05/20   Nira Conn, MD  predniSONE 5 MG/5ML solution Take by mouth. 07/03/20 07/06/20  [provider]     Results for orders placed or performed during the hospital encounter of 07/05/20 (from the past 48 hour(s))  CBC with Differential/Platelet     Status: Abnormal   Collection Time: 07/05/20  3:17 AM  Result Value Ref Range   WBC 15.6 (H) 4.0 - 10.5 K/uL   RBC 4.69 4.22 - 5.81 MIL/uL   Hemoglobin 15.9 13.0 - 17.0 g/dL   HCT 12.7 39 - 52 %   MCV 96.4 80.0 - 100.0 fL   MCH 33.9 26.0 - 34.0 pg    MCHC 35.2 30.0 - 36.0 g/dL   RDW 51.7 00.1 - 74.9 %   Platelets 243 150 - 400 K/uL   nRBC 0.0 0.0 - 0.2 %   Neutrophils Relative % 77 %   Neutro Abs 12.0 (H) 1.7 - 7.7 K/uL   Lymphocytes Relative 13 %   Lymphs Abs 2.0 0.7 - 4.0 K/uL   Monocytes Relative 10 %   Monocytes Absolute 1.5 (H) 0 - 1 K/uL   Eosinophils Relative 0 %   Eosinophils Absolute 0.0 0 - 0 K/uL   Basophils Relative 0 %   Basophils Absolute 0.1 0 - 0 K/uL   Immature Granulocytes 0 %   Abs Immature Granulocytes 0.06 0.00 - 0.07 K/uL    Comment: Performed at [facility], [address] [place], [place] [address]  Basic metabolic panel     Status: Abnormal   Collection Time: 07/05/20  3:17 AM  Result Value Ref Range   Sodium 139 135 - 145 mmol/L   Potassium 3.7 3.5 - 5.1 mmol/L   Chloride 101 98 - 111 mmol/L   CO2 26 22 - 32 mmol/L   Glucose, Bld 115 (H) 70 - 99 mg/dL    Comment: Glucose reference range applies only to samples taken after fasting for at least 8 hours.   BUN 19 8 - 23  mg/dL   Creatinine, Ser 7.51 0.61 - 1.24 mg/dL   Calcium 8.7 (L) 8.9 - 10.3 mg/dL   GFR calc non Af Amer >60 >60 mL/min   GFR calc Af Amer >60 >60 mL/min   Anion gap 12 5 - 15    Comment: Performed at [facility], [address], [place], [place] [address]    CT Soft Tissue Neck W Contrast  Result Date: 07/05/2020 CLINICAL DATA:  Epiglottitis or tonsillitis suspected. Sore throat for 3 days EXAM: CT NECK WITH CONTRAST TECHNIQUE: Multidetector CT imaging of the neck was performed using the standard protocol following the bolus administration of intravenous contrast. CONTRAST:  47mL OMNIPAQUE IOHEXOL 300 MG/ML  SOLN COMPARISON:  None. FINDINGS: Pharynx and larynx: 18 mm left peritonsillar collection with regional swelling. There is pooling of secretions in the hypopharynx. Salivary glands: Negative Thyroid: Normal Lymph nodes: Adenitis appearance along the left jugular chain, non cavitated and overall mild.  Vascular: Negative.  No venous thrombosis. Limited intracranial: Negative Visualized orbits: Negative Mastoids and visualized paranasal sinuses: Clear sinuses. Nasal septum perforation with smooth margins. Skeleton: Spondylosis.  No impingement Upper chest: Negative IMPRESSION: 18 mm left peritonsillar abscess. Electronically Signed   By: Marnee Spring M.D.   On: 07/05/2020 05:00   Review of Systems  Constitutional: Negative for fever.  HENT: Positive for sore throat and trouble swallowing.   Respiratory: Negative for shortness of breath.   All other systems reviewed and are negative.  Physical Exam: Blood pressure (!) 143/81, pulse 80, temperature 98.2 F (36.8 C), temperature source Oral, resp. rate 17, SpO2 91 %. General appearance: alert, cooperative, appears stated age and no distress  Eyes: Pupils are equal, round, reactive to light. Extraocular motion is intact.  Ears: Examination of the ears shows normal auricles and external auditory canals bilaterally.  Nose: Nasal examination shows normal mucosa, septum, turbinates.  Face: Facial examination shows no asymmetry. Palpation of the face elicit no significant tenderness.  Mouth: Oral cavity examination shows edematous left peritonsillar area.  The uvula is slightly deviated to the right. Neck: Palpation of the neck reveals no lymphadenopathy or mass. The trachea is midline.  Neuro: Cranial nerves 2-12 are all grossly in tact.  Procedure: Incision and drainage of the left peritonsillar abscess.   Anesthesia: local anesthesia with 1% lidocaine with epinephrine.   Description: The patient is placed upright on the hospital bed.  After adequate local anesthesia is achieved, an 18-G spinal needle is used to make multiple passes over the left superior tonsillar pole.  Approximately 1 cc of purulent fluid was evacuated.  The abscess cavity was incised opened with the spinal needle tip.  The patient tolerated the procedure well.     Assessment/Plan: Left peritonsillar abscess -The patient may be discharged home on oral clindamycin 300 mg p.o. 4 times daily for 10 days. -Prednisone Dosepak for 6 days. -Pain medication as needed. -The patient may follow-up in my office in 1 week if he continues to be symptomatic.  Hannelore Bova W Jadyn Brasher 07/06/2020, 9:10 PM

## 2020-07-06 NOTE — ED Notes (Signed)
ENT at bedside

## 2020-07-06 NOTE — ED Provider Notes (Signed)
[facility]-EMERGENCY DEPT Provider Note   CSN: [id number] Arrival date & time: 07/06/20  1714     History Chief Complaint  Patient presents with  . Abscess    Todd Herrera is a 64 y.o. male.  HPI 64 year old male presents with left sided throat pain.  Started about 4 days ago.  Went to urgent care and was prescribed amoxicillin.  Did not get better and started to get worse so he went to the emergency department.  Was seen in the night of 7/13-14.  Had a CT that showed a 1.8 cm peritonsillar abscess on the left.  He states that he has been continuing the antibiotics since but at this point, he can barely swallow his own spit because it is so painful and because of difficulty swallowing.  He is no longer able to get down medicines even liquid.  Called ENT but could not be seen for another 10 days.  Was told to come back to the ER. No fevers or dyspnea. Decreased PO intake.   No past medical history on file.  There are no problems to display for this patient.   Past Surgical History:  Procedure Laterality Date  . leg surgery         No family history on file.  Social History   Tobacco Use  . Smoking status: Never Smoker  . Smokeless tobacco: Never Used  Substance Use Topics  . Alcohol use: Yes  . Drug use: No    Home Medications Prior to Admission medications   Medication Sig Start Date End Date Taking? Authorizing Provider  amoxicillin-clavulanate (AUGMENTIN) 400-57 MG/5ML suspension 10 ml, po, bid for 7 days 07/03/20   [provider]  clindamycin (CLEOCIN) 300 MG capsule Take 1 capsule (300 mg total) by mouth 4 (four) times daily for 10 days. X 7 days 07/06/20 07/16/20  Pricilla Loveless, MD  HYDROcodone-acetaminophen (HYCET) 7.5-325 mg/15 ml solution Take 10 mLs by mouth every 4 (four) hours as needed. 07/06/20   Pricilla Loveless, MD  lidocaine (XYLOCAINE) 2 % solution Use as directed 15 mLs in the mouth or throat as needed for mouth pain.  07/05/20   Cardama, Amadeo Garnet, MD  predniSONE (STERAPRED UNI-PAK 21 TAB) 10 MG (21) TBPK tablet Take 60 mg on day 1, 50 mg day 2, 40 mg day 3, 30 mg day 4, 20 mg day 5, 10 mg day 6 and then stop 07/06/20   Pricilla Loveless, MD    Allergies    Patient has no known allergies.  Review of Systems   Review of Systems  Constitutional: Negative for fever.  HENT: Positive for sore throat and trouble swallowing.   Respiratory: Negative for shortness of breath.   All other systems reviewed and are negative.   Physical Exam Updated Vital Signs BP (!) 136/106   Pulse 70   Temp 98.2 F (36.8 C) (Oral)   Resp 16   SpO2 92%   Physical Exam Vitals and nursing note reviewed.  Constitutional:      Appearance: He is well-developed. He is not ill-appearing or diaphoretic.  HENT:     Head: Normocephalic and atraumatic.     Right Ear: External ear normal.     Left Ear: External ear normal.     Nose: Nose normal.     Mouth/Throat:     Comments: Difficult to get a full oropharyngeal exam but appears to have left sided PTA. No drooling or trismus. No hot-potato voice.  Eyes:     General:        Right eye: No discharge.        Left eye: No discharge.  Cardiovascular:     Rate and Rhythm: Normal rate and regular rhythm.     Heart sounds: Normal heart sounds.  Pulmonary:     Effort: Pulmonary effort is normal.     Breath sounds: Normal breath sounds.  Abdominal:     Palpations: Abdomen is soft.     Tenderness: There is no abdominal tenderness.  Musculoskeletal:     Cervical back: Neck supple.  Skin:    General: Skin is warm and dry.  Neurological:     Mental Status: He is alert.  Psychiatric:        Mood and Affect: Mood is not anxious.     ED Results / Procedures / Treatments   Labs (all labs ordered are listed, but only abnormal results are displayed) Labs Reviewed - No data to display  EKG None  Radiology CT Soft Tissue Neck W Contrast  Result Date: 07/05/2020 CLINICAL  DATA:  Epiglottitis or tonsillitis suspected. Sore throat for 3 days EXAM: CT NECK WITH CONTRAST TECHNIQUE: Multidetector CT imaging of the neck was performed using the standard protocol following the bolus administration of intravenous contrast. CONTRAST:  70mL OMNIPAQUE IOHEXOL 300 MG/ML  SOLN COMPARISON:  None. FINDINGS: Pharynx and larynx: 18 mm left peritonsillar collection with regional swelling. There is pooling of secretions in the hypopharynx. Salivary glands: Negative Thyroid: Normal Lymph nodes: Adenitis appearance along the left jugular chain, non cavitated and overall mild. Vascular: Negative.  No venous thrombosis. Limited intracranial: Negative Visualized orbits: Negative Mastoids and visualized paranasal sinuses: Clear sinuses. Nasal septum perforation with smooth margins. Skeleton: Spondylosis.  No impingement Upper chest: Negative IMPRESSION: 18 mm left peritonsillar abscess. Electronically Signed   By: Marnee Spring M.D.   On: 07/05/2020 05:00    Procedures Procedures (including critical care time)  Medications Ordered in ED Medications  sodium chloride 0.9 % bolus 1,000 mL (0 mLs Intravenous Stopped 07/06/20 2136)  fentaNYL (SUBLIMAZE) injection 100 mcg (100 mcg Intravenous Given 07/06/20 1839)  dexamethasone (DECADRON) injection 20 mg (20 mg Intravenous Given 07/06/20 1920)  clindamycin (CLEOCIN) IVPB 900 mg (0 mg Intravenous Stopped 07/06/20 2136)  lidocaine-EPINEPHrine (XYLOCAINE W/EPI) 2 %-1:200000 (PF) injection 20 mL (20 mLs Other Given by Other 07/06/20 1920)    ED Course  I have reviewed the triage vital signs and the nursing notes.  Pertinent labs & imaging results that were available during my care of the patient were reviewed by me and considered in my medical decision making (see chart for details).    MDM Rules/Calculators/A&P                          Patient is protecting his airway, but having trouble swallowing PO and seems to progressively be worsening. Given  IV fluids, fentanyl. D/w Dr. Suszanne Conners, who asks for decadron 20 mg IV, 900 mg IV clinda, and he can and drained it at the bedside. Asks for patient to have a 6 day prednisone dosepak and clindamycin x 10 days. Follow up with him as needed or if not improving. Patient feels better and understands return precautions.  Final Clinical Impression(s) / ED Diagnoses Final diagnoses:  Peritonsillar abscess    Rx / DC Orders ED Discharge Orders         Ordered    clindamycin (CLEOCIN) 300  MG capsule  4 times daily     Discontinue  Reprint     07/06/20 2126    predniSONE (STERAPRED UNI-PAK 21 TAB) 10 MG (21) TBPK tablet     Discontinue  Reprint     07/06/20 2126    HYDROcodone-acetaminophen (HYCET) 7.5-325 mg/15 ml solution  Every 4 hours PRN     Discontinue  Reprint     07/06/20 2126           Pricilla Loveless, MD 07/07/20 0017

## 2020-07-06 NOTE — Telephone Encounter (Signed)
TOC CM received call from pt's spouse stating he will be coming back to ED for follow up, unable to eat or take medications in past few days. Isidoro Donning RN CCM, [facility] ED TOC CM [phone]

## 2020-07-06 NOTE — Discharge Instructions (Signed)
If you develop fever, new or worsening swelling of your throat, neck stiffness, or any other new/concerning symptoms, then return to the ER for evaluation.

## 2020-07-07 MED FILL — CLINDAMYCIN HCL 300 MG CAP: 300 | 10 days supply | Qty: 40 | Fill #0

## 2020-07-07 MED FILL — HYDROCOD-APAP 7.5-325/15ML: 7.5-325 | 2 days supply | Qty: 118 | Fill #0

## 2020-10-16 ENCOUNTER — Telehealth: Payer: Self-pay | Admitting: Nurse Practitioner

## 2020-10-16 ENCOUNTER — Other Ambulatory Visit: Payer: 59

## 2020-10-16 DIAGNOSIS — Z20822 Contact with and (suspected) exposure to covid-19: Secondary | ICD-10-CM

## 2020-10-16 NOTE — Telephone Encounter (Signed)
Returned patient's call to mAB hotline phone. He attended large event and few days later became symptomatic of covid like illness. He has no known exposures and has not yet been tested. He has appointment to be tested today. I encouraged him to follow through with that testing appointment. Discussed referral process for mAB should he or others test positive. Also reviewed quarantine procedures and symptomatic support.

## 2020-10-17 LAB — SARS-COV-2, NAA 2 DAY TAT

## 2020-10-17 LAB — NOVEL CORONAVIRUS, NAA: SARS-CoV-2, NAA: NOT DETECTED

## 2020-10-19 ENCOUNTER — Telehealth (HOSPITAL_COMMUNITY): Payer: Self-pay

## 2020-10-19 ENCOUNTER — Ambulatory Visit: Payer: Self-pay

## 2020-10-19 NOTE — Telephone Encounter (Signed)
Pt. Called to report he was positive for COVID approx. One month ago. Stated he completed his quarantine, and was feeling better.  But on Sat., 10/23, he started having stuffy nose, and loss of taste.  Retested for COVID on Wed., 10/27, and stated he was negative.  Today reported he has runny nose, head and chest congestion, and fatigue.  Also, c/o moderate shortness of breath with mild activity. Stated has some chest discomfort with cough.  Reported is coughing up white phegm.  Denied fever at this time.  Pt had multiple questions re: symptom management, and if he is eligible for the antibody infusion treatment.  Advised that even though negative test results on 10/27, he should follow quarantine guidelines again; 10 days from onset of sx's.  Advised to rest, hydrate well, monitor for worsening of symptoms.  Advised to call his PCP and discuss his current symptoms.  Also advised to go to ER if worsening shortness of breath, increased weakness, and increased chest discomfort with tightness or heaviness.  Pt. Verb. Understanding.  Transferred pt. To the MAB Infusion hotline, to leave contact information to receive a call back re: poss. treatment.  Pt. verb. understanding and agreed with plan.        Reason for Disposition . [1] PERSISTING SYMPTOMS OF COVID-19 AND [2] symptoms WORSE  Answer Assessment - Initial Assessment Questions 1. COVID-19 ONSET: "When did the symptoms of COVID-19 first start?"     Had COVID around 9/23 2. DIAGNOSIS CONFIRMATION: "How were you diagnosed?" (e.g., COVID-19 oral or nasal viral test; COVID-19 antibody test; doctor visit)     Tested positive approx. 9/24 3. MAIN SYMPTOM:  "What is your main concern or symptom right now?" (e.g., breathing difficulty, cough, fatigue. loss of smell)     Recurrence of symptoms of head and chest congestion, runny nose, some shortness of breath with activity, feeling very tired 4. SYMPTOM ONSET: "When did the  *No Answer*  start?"     Sx's  reoccurred on 10/23 5. BETTER-SAME-WORSE: "Are you getting better, staying the same, or getting worse over the last 1 to 2 weeks?"     Feeling some better over the past couple of days 6. RECENT MEDICAL VISIT: "Have you been seen by a healthcare provider (doctor, NP, PA) for these persisting COVID-19 symptoms?" If Yes, ask: "When were you seen?" (e.g., date)     *No Answer* 7. COUGH: "Do you have a cough?" If Yes, ask: "How bad is the cough?"       Coughing up mucus 8. FEVER: "Do you have a fever?" If Yes, ask: "What is your temperature, how was it measured, and when did it start?"     Denied fever  9. BREATHING DIFFICULTY: "Are you having any trouble breathing?" If Yes, ask: "How bad is your breathing?" (e.g., mild, moderate, severe)    - MILD: No SOB at rest, mild SOB with walking, speaks normally in sentences, can lay down, no retractions, pulse < 100.    - MODERATE: SOB at rest, SOB with minimal exertion and prefers to sit, cannot lie down flat, speaks in phrases, mild retractions, audible wheezing, pulse 100-120.    - SEVERE: Very SOB at rest, speaks in single words, struggling to breathe, sitting hunched forward, retractions, pulse > 120       Moderate SOB with activity 10. HIGH RISK DISEASE: "Do you have any chronic medical problems?" (e.g., asthma, heart or lung disease, weak immune system, obesity, etc.)       *  No Answer* 11. PREGNANCY: "Is there any chance you are pregnant?" "When was your last menstrual period?"       N/a  12. OTHER SYMPTOMS: "Do you have any other symptoms?"  (e.g., fatigue, headache, muscle pain, weakness)       Fatigue, stuffiness of nose ; coughing up white phlegm; some chest discomfort with coughing  Protocols used: CORONAVIRUS (COVID-19) PERSISTING SYMPTOMS FOLLOW-UP CALL-A-AH  Message from Leary Roca sent at 10/19/2020 4:01 PM EDT  Pt called in to speak with a nurse regarding covid symptoms . Pt has tested negative for covid however he does not  understand why its negative. Please call pt back .

## 2020-10-19 NOTE — Telephone Encounter (Signed)
Called to Discuss with patient about Covid symptoms and the use of the monoclonal antibody infusion for those with mild to moderate Covid symptoms and at a high risk of hospitalization.     Pt appears to qualify for this infusion due to co-morbid conditions and/or a member of an at-risk group in accordance with the FDA Emergency Use Authorization.    Pt stated he tested positive with COVID about 3 weeks ago, quarantined and recovered. He stated he recently went to a work event and started having the same symptoms, as he previously did with COVID, started symptoms on Sunday 10/24, he tested for COVID on 10/26, negative test. Pt states he has tiredness, congestion, and loss of taste/smell. Pt advised RN would call NP and follow up with plan of care.   RN called patient back and advised him to follow up with his PCP as per Rexene Alberts, NP, highly doubt it is a re-infection with COVID. Per S. Dixon, NP, it may be sinus infection, flu or persistent symptoms of COVID, pt provided with [facility] phone number [phone]. Pt understands without assistance.

## 2020-10-20 ENCOUNTER — Telehealth (HOSPITAL_COMMUNITY): Payer: Self-pay

## 2020-10-20 ENCOUNTER — Telehealth: Payer: Self-pay | Admitting: Infectious Diseases

## 2020-10-20 NOTE — Telephone Encounter (Signed)
Called to Discuss with patient about Covid symptoms and the use of the monoclonal antibody infusion for those with mild to moderate Covid symptoms and at a high risk of hospitalization.     Pt appears to qualify for this infusion due to co-morbid conditions and/or a member of an at-risk group in accordance with the FDA Emergency Use Authorization.    Pt left VM on MAB Infusion Hotline. This RN returned phone call, pt states that he spoke with Corrie Dandy, RN yesterday regarding his sx but still has confusion as to whether he qualifies or not.  Pt diagnosed with COVID 09/16/20 and fully recovered. Pt states he began with sx of COVID on 10/14/20 and tested negative at 10/16/20. As per note from yesterday, pt advised to follow-up with PCP. Pt reports he has appt Monday but is still confused as to whether he is eligible to receive monoclonal antibody or not. Referred information to APP and requested to call back and give clarification to patient.

## 2020-10-20 NOTE — Telephone Encounter (Signed)
Asked to speak with Mr. Desroches about symptom timeline   He is confused. He has had a rough month. September 23rd he had symptoms c/w COVID infection with nasal symptoms, loss of taste and smell. PCR test was (+) at that time on the 25th.   He quarantined as indicated. All symptoms cleared up during that time frame. 3 weeks after his illness he attended an event with job training for a week. 2 days after he came back he had same exact symptoms with runny nose, more mucus build up, no fevers but high fatigue. Now 7 days into symptoms and most are resolving but he does feel have some dental pain and some pressure in sinuses with mild headaches. He does have some chest pain and some cough from drainage but that seems to be better. Does not sound like he has the flu, possible other viral URI he was exposed to with recent event. I think it would be reasonable to have him call his PCP to consider course of antibiotics for sinus infection with Augmentin.   He would like to get vaccinated - he was fully vaccinated against Pfizer with last vaccine 04/10/2020. I recommended he recover from this current illness then consider his booster vaccine. He thanked me for my time and plans to follow up with his PCP. He agrees that his symptoms seem to be related to sinus infection at this time.   Rexene Alberts, MSN, NP-C [facility]  [email or web address] Pager: [phone] Office: [phone] [facility] Main Line: [phone]

## 2020-12-01 DIAGNOSIS — K429 Umbilical hernia without obstruction or gangrene: Secondary | ICD-10-CM | POA: Diagnosis not present

## 2020-12-01 DIAGNOSIS — M6208 Separation of muscle (nontraumatic), other site: Secondary | ICD-10-CM | POA: Diagnosis not present

## 2020-12-01 DIAGNOSIS — Z13220 Encounter for screening for lipoid disorders: Secondary | ICD-10-CM | POA: Diagnosis not present

## 2020-12-01 DIAGNOSIS — Z0000 Encounter for general adult medical examination without abnormal findings: Secondary | ICD-10-CM | POA: Diagnosis not present

## 2020-12-01 DIAGNOSIS — Z1329 Encounter for screening for other suspected endocrine disorder: Secondary | ICD-10-CM | POA: Diagnosis not present

## 2020-12-01 DIAGNOSIS — Z125 Encounter for screening for malignant neoplasm of prostate: Secondary | ICD-10-CM | POA: Diagnosis not present

## 2020-12-01 DIAGNOSIS — R140 Abdominal distension (gaseous): Secondary | ICD-10-CM | POA: Diagnosis not present

## 2020-12-01 DIAGNOSIS — Z0001 Encounter for general adult medical examination with abnormal findings: Secondary | ICD-10-CM | POA: Diagnosis not present

## 2021-01-31 DIAGNOSIS — K429 Umbilical hernia without obstruction or gangrene: Secondary | ICD-10-CM | POA: Diagnosis not present

## 2021-07-13 ENCOUNTER — Other Ambulatory Visit (HOSPITAL_BASED_OUTPATIENT_CLINIC_OR_DEPARTMENT_OTHER): Payer: Self-pay

## 2021-07-13 ENCOUNTER — Other Ambulatory Visit: Payer: Self-pay | Admitting: Physician Assistant

## 2021-07-13 DIAGNOSIS — R198 Other specified symptoms and signs involving the digestive system and abdomen: Secondary | ICD-10-CM | POA: Diagnosis not present

## 2021-07-13 DIAGNOSIS — R634 Abnormal weight loss: Secondary | ICD-10-CM | POA: Diagnosis not present

## 2021-07-13 DIAGNOSIS — K429 Umbilical hernia without obstruction or gangrene: Secondary | ICD-10-CM | POA: Diagnosis not present

## 2021-07-13 DIAGNOSIS — R1013 Epigastric pain: Secondary | ICD-10-CM

## 2021-07-13 MED ORDER — PANTOPRAZOLE SODIUM 40 MG PO TBEC
DELAYED_RELEASE_TABLET | ORAL | 0 refills | Status: DC
  Filled 2021-07-13: qty 30, 30d supply, fill #0

## 2021-07-20 ENCOUNTER — Ambulatory Visit
Admission: RE | Admit: 2021-07-20 | Discharge: 2021-07-20 | Disposition: A | Discharge status: Home / Self Care | Payer: 59 | Source: Ambulatory Visit | Attending: Physician Assistant | Admitting: Physician Assistant

## 2021-07-20 ENCOUNTER — Other Ambulatory Visit: Payer: Self-pay

## 2021-07-20 DIAGNOSIS — R1013 Epigastric pain: Secondary | ICD-10-CM

## 2021-07-20 DIAGNOSIS — I700 Atherosclerosis of aorta: Secondary | ICD-10-CM | POA: Diagnosis not present

## 2021-07-20 DIAGNOSIS — R634 Abnormal weight loss: Secondary | ICD-10-CM | POA: Diagnosis not present

## 2021-07-20 DIAGNOSIS — K429 Umbilical hernia without obstruction or gangrene: Secondary | ICD-10-CM | POA: Diagnosis not present

## 2021-07-20 DIAGNOSIS — K5730 Diverticulosis of large intestine without perforation or abscess without bleeding: Secondary | ICD-10-CM | POA: Diagnosis not present

## 2021-07-20 MED ORDER — IOPAMIDOL (ISOVUE-370) INJECTION 76%
75.0000 mL | Freq: Once | INTRAVENOUS | Status: AC | PRN
  Administered 2021-07-20: 75 mL via INTRAVENOUS

## 2021-08-03 ENCOUNTER — Other Ambulatory Visit (HOSPITAL_BASED_OUTPATIENT_CLINIC_OR_DEPARTMENT_OTHER): Payer: Self-pay

## 2021-08-03 MED ORDER — PANTOPRAZOLE SODIUM 40 MG PO TBEC
DELAYED_RELEASE_TABLET | ORAL | 6 refills | Status: DC
  Filled 2021-08-03: qty 30, 30d supply, fill #0
  Filled 2021-09-04: qty 30, 30d supply, fill #1
  Filled 2021-10-08: qty 30, 30d supply, fill #2
  Filled 2021-11-08: qty 30, 30d supply, fill #3
  Filled 2021-12-11: qty 30, 30d supply, fill #4
  Filled 2022-01-16: qty 30, 30d supply, fill #5
  Filled 2022-02-21: qty 30, 30d supply, fill #6

## 2021-08-06 ENCOUNTER — Other Ambulatory Visit (HOSPITAL_BASED_OUTPATIENT_CLINIC_OR_DEPARTMENT_OTHER): Payer: Self-pay

## 2021-08-08 ENCOUNTER — Emergency Department (HOSPITAL_BASED_OUTPATIENT_CLINIC_OR_DEPARTMENT_OTHER)
Admission: EM | Admit: 2021-08-08 | Discharge: 2021-08-08 | Disposition: A | Discharge status: Home / Self Care | Payer: 59 | Attending: Emergency Medicine | Admitting: Emergency Medicine

## 2021-08-08 ENCOUNTER — Emergency Department (HOSPITAL_BASED_OUTPATIENT_CLINIC_OR_DEPARTMENT_OTHER): Payer: 59 | Admitting: Radiology

## 2021-08-08 ENCOUNTER — Other Ambulatory Visit: Payer: Self-pay

## 2021-08-08 ENCOUNTER — Other Ambulatory Visit (HOSPITAL_BASED_OUTPATIENT_CLINIC_OR_DEPARTMENT_OTHER): Payer: Self-pay

## 2021-08-08 ENCOUNTER — Encounter (HOSPITAL_BASED_OUTPATIENT_CLINIC_OR_DEPARTMENT_OTHER): Payer: Self-pay | Admitting: *Deleted

## 2021-08-08 DIAGNOSIS — I10 Essential (primary) hypertension: Secondary | ICD-10-CM | POA: Insufficient documentation

## 2021-08-08 DIAGNOSIS — R079 Chest pain, unspecified: Secondary | ICD-10-CM | POA: Diagnosis not present

## 2021-08-08 DIAGNOSIS — I159 Secondary hypertension, unspecified: Secondary | ICD-10-CM

## 2021-08-08 DIAGNOSIS — R0789 Other chest pain: Secondary | ICD-10-CM | POA: Diagnosis present

## 2021-08-08 LAB — BASIC METABOLIC PANEL
Anion gap: 10 (ref 5–15)
BUN: 10 mg/dL (ref 8–23)
CO2: 24 mmol/L (ref 22–32)
Calcium: 9.6 mg/dL (ref 8.9–10.3)
Chloride: 102 mmol/L (ref 98–111)
Creatinine, Ser: 0.93 mg/dL (ref 0.61–1.24)
GFR, Estimated: >60 mL/min (ref >60)
Glucose, Bld: 119 mg/dL — ABNORMAL HIGH (ref 70–99)
Potassium: 3.8 mmol/L (ref 3.5–5.1)
Sodium: 136 mmol/L (ref 135–145)

## 2021-08-08 LAB — CBC
HCT: 42.8 % (ref 39.0–52.0)
Hemoglobin: 15.3 g/dL (ref 13.0–17.0)
MCH: 32.6 pg (ref 26.0–34.0)
MCHC: 35.7 g/dL (ref 30.0–36.0)
MCV: 91.3 fL (ref 80.0–100.0)
Platelets: 249 10*3/uL (ref 150–400)
RBC: 4.69 MIL/uL (ref 4.22–5.81)
RDW: 13.1 % (ref 11.5–15.5)
WBC: 7.2 10*3/uL (ref 4.0–10.5)
nRBC: 0 % (ref 0.0–0.2)

## 2021-08-08 LAB — TROPONIN I (HIGH SENSITIVITY)
Troponin I (High Sensitivity): 4 ng/L (ref <18)
Troponin I (High Sensitivity): 5 ng/L (ref <18)

## 2021-08-08 MED ORDER — HYDROCHLOROTHIAZIDE 12.5 MG PO TABS
12.5000 mg | ORAL_TABLET | Freq: Every day | ORAL | 0 refills | Status: DC
  Filled 2021-08-08: qty 30, 30d supply, fill #0

## 2021-08-08 NOTE — ED Provider Notes (Signed)
[facility] EMERGENCY DEPT Provider Note   CSN: [id number] Arrival date & time: 08/08/21  1526     History Chief Complaint  Patient presents with   Hypertension   Chest Pain    Todd Herrera is a 65 y.o. male.  HPI  65 year old male presents emergency department concern for high blood pressure.  Patient states 2 days ago he had a brief episode of left-sided chest tightness.  That resolved.  Today when he was picking up medications at the pharmacy they took his blood pressure and stated that it was "too high" and referred him to the emergency room.  Patient denies any recent chest heaviness/tightness or shortness of breath.  He has otherwise been in his usual state of health.  No new medications.  Has never required medicine for high blood pressure before, currently does not have a primary doctor.  History reviewed. No pertinent past medical history.  There are no problems to display for this patient.   Past Surgical History:  Procedure Laterality Date   leg surgery         No family history on file.  Social History   Tobacco Use   Smoking status: Never   Smokeless tobacco: Never  Vaping Use   Vaping Use: Never used  Substance Use Topics   Alcohol use: Yes   Drug use: No    Home Medications Prior to Admission medications   Medication Sig Start Date End Date Taking? Authorizing Provider  hydrochlorothiazide (HYDRODIURIL) 12.5 MG tablet Take 1 tablet (12.5 mg total) by mouth daily. 08/08/21 09/07/21 Yes Parv Manthey, Clabe Seal, DO  pantoprazole (PROTONIX) 40 MG tablet 1 tablet Orally Once a day 30 day(s) 08/03/21  Yes   amoxicillin-clavulanate (AUGMENTIN) 400-57 MG/5ML suspension 10 ml, po, bid for 7 days 07/03/20   [provider]  HYDROcodone-acetaminophen (HYCET) 7.5-325 mg/15 ml solution Take 10 mLs by mouth every 4 (four) hours as needed. 07/06/20   Pricilla Loveless, MD  lidocaine (XYLOCAINE) 2 % solution Use as directed 15 mLs in the mouth or  throat as needed for mouth pain. 07/05/20   Cardama, Amadeo Garnet, MD  predniSONE (STERAPRED UNI-PAK 21 TAB) 10 MG (21) TBPK tablet Take 60 mg on day 1, 50 mg day 2, 40 mg day 3, 30 mg day 4, 20 mg day 5, 10 mg day 6 and then stop 07/06/20   Pricilla Loveless, MD    Allergies    Patient has no known allergies.  Review of Systems   Review of Systems  Constitutional:  Negative for chills and fever.  HENT:  Negative for congestion.   Eyes:  Negative for visual disturbance.  Respiratory:  Positive for chest tightness. Negative for shortness of breath.   Cardiovascular:  Negative for chest pain, palpitations and leg swelling.  Gastrointestinal:  Negative for abdominal pain, diarrhea and vomiting.  Genitourinary:  Negative for dysuria.  Skin:  Negative for rash.  Neurological:  Negative for headaches.   Physical Exam Updated Vital Signs BP (!) 148/95 (BP Location: Right Arm)   Pulse (!) 59   Temp 98.4 F (36.9 C) (Oral)   Resp 18   Ht 5' 10.5" (1.791 m)   Wt 93 kg   SpO2 97%   BMI 29.00 kg/m   Physical Exam Vitals and nursing note reviewed.  Constitutional:      Appearance: Normal appearance. He is not ill-appearing or diaphoretic.  HENT:     Head: Normocephalic.     Mouth/Throat:  Mouth: Mucous membranes are moist.  Cardiovascular:     Rate and Rhythm: Normal rate.  Pulmonary:     Effort: Pulmonary effort is normal. No respiratory distress.  Abdominal:     Palpations: Abdomen is soft.     Tenderness: There is no abdominal tenderness.  Musculoskeletal:     Right lower leg: No edema.     Left lower leg: No edema.  Skin:    General: Skin is warm.  Neurological:     Mental Status: He is alert and oriented to person, place, and time. Mental status is at baseline.  Psychiatric:        Mood and Affect: Mood normal.    ED Results / Procedures / Treatments   Labs (all labs ordered are listed, but only abnormal results are displayed) Labs Reviewed  BASIC METABOLIC PANEL  - Abnormal; Notable for the following components:      Result Value   Glucose, Bld 119 (*)    All other components within normal limits  CBC  TROPONIN I (HIGH SENSITIVITY)  TROPONIN I (HIGH SENSITIVITY)    EKG EKG Interpretation  Date/Time:  Wednesday August 08 2021 15:36:55 EDT Ventricular Rate:  85 PR Interval:  134 QRS Duration: 80 QT Interval:  356 QTC Calculation: 423 R Axis:   77 Text Interpretation: Normal sinus rhythm Nonspecific ST abnormality Abnormal ECG NSR, no STEMI Confirmed by Coralee Pesa [phone]) on 08/08/2021 8:32:37 PM  Radiology DG Chest 2 View  Result Date: 08/08/2021 CLINICAL DATA:  Chest pain. EXAM: CHEST - 2 VIEW COMPARISON:  None. FINDINGS: The heart size and mediastinal contours are within normal limits. No focal consolidation. No pulmonary edema. No pleural effusion. No pneumothorax. No acute osseous abnormality. IMPRESSION: No active cardiopulmonary disease. Electronically Signed   By: Tish Frederickson M.D.   On: 08/08/2021 16:37    Procedures Procedures   Medications Ordered in ED Medications - No data to display  ED Course  I have reviewed the triage vital signs and the nursing notes.  Pertinent labs & imaging results that were available during my care of the patient were reviewed by me and considered in my medical decision making (see chart for details).    MDM Rules/Calculators/A&P                            65 year old male presents emergency department with concern for a resolved episode of left-sided chest heaviness 2 days ago as well as high blood pressure.  Patient has never required medications for high blood pressure before.  Currently patient denies any chest pain, chest tightness or shortness of breath.  Blood pressure is systolic 160s on arrival, otherwise vitals are normal.  Blood work is reassuring.  Troponin is negative x2 with a delta of 1.  Chest x-ray is unremarkable.  EKG shows sinus rhythm with nonspecific ST changes.   Patient currently does not have a primary doctor.  With the time that the patient has been here his blood pressure has reduced to the 130/140 systolic without any intervention but he does periodically still have blood pressure readings in the 160s systolic.  Plan to put the patient on low-dose hydrochlorothiazide and have him establish primary care and follow-up as an outpatient.  Given strict return to ED precautions.  Patient at this time appears safe and stable for discharge and will be treated as an outpatient.  Discharge plan and strict return to ED precautions discussed, patient verbalizes  understanding and agreement.  Final Clinical Impression(s) / ED Diagnoses Final diagnoses:  Secondary hypertension    Rx / DC Orders ED Discharge Orders          Ordered    hydrochlorothiazide (HYDRODIURIL) 12.5 MG tablet  Daily        08/08/21 2133             Rozelle Logan, DO 08/08/21 2219

## 2021-08-08 NOTE — ED Notes (Signed)
PT. Complains of high blood pressure. BP. 146/98 resting.

## 2021-08-08 NOTE — Discharge Instructions (Addendum)
You have been seen and discharged from the emergency department.  Your work-up is normal.  Take prescription as prescribed daily.  You may establish primary care here at [facility].  You have been given information in regards to this, Todd Herrera is excepting new patients, you may schedule an appointment online.  Follow-up with your primary provider for reevaluation and further care. Take home medications as prescribed. If you have any worsening symptoms or further concerns for your health please return to an emergency department for further evaluation.

## 2021-08-08 NOTE — ED Triage Notes (Signed)
Pt states about 2 days ago left side chest tightness that lasted most of the night, Today had bp checked at drug store 184/108 on triage 160/102

## 2021-08-09 ENCOUNTER — Other Ambulatory Visit (HOSPITAL_BASED_OUTPATIENT_CLINIC_OR_DEPARTMENT_OTHER): Payer: Self-pay

## 2021-08-09 MED ORDER — OMRON 3 SERIES BP MONITOR DEVI
0 refills | Status: AC
  Filled 2021-08-09: qty 1, 1d supply, fill #0

## 2021-08-10 ENCOUNTER — Other Ambulatory Visit (HOSPITAL_BASED_OUTPATIENT_CLINIC_OR_DEPARTMENT_OTHER): Payer: Self-pay

## 2021-08-10 ENCOUNTER — Ambulatory Visit: Payer: 59 | Attending: Internal Medicine

## 2021-08-10 ENCOUNTER — Other Ambulatory Visit: Payer: Self-pay

## 2021-08-10 DIAGNOSIS — Z23 Encounter for immunization: Secondary | ICD-10-CM

## 2021-08-13 ENCOUNTER — Other Ambulatory Visit (HOSPITAL_BASED_OUTPATIENT_CLINIC_OR_DEPARTMENT_OTHER): Payer: Self-pay

## 2021-08-13 MED ORDER — PFIZER-BIONT COVID-19 VAC-TRIS 30 MCG/0.3ML IM SUSP
INTRAMUSCULAR | 0 refills | Status: DC
  Filled 2021-08-13: qty 0.3, 1d supply, fill #0

## 2021-08-13 NOTE — Progress Notes (Signed)
   Covid-19 Vaccination Clinic  Name:  Todd Herrera    MRN: [id number] DOB: May 29, 1956  08/13/2021  Todd Herrera was observed post Covid-19 immunization for 15 minutes without incident. He was provided with Vaccine Information Sheet and instruction to access the V-Safe system.   Todd Herrera was instructed to call 911 with any severe reactions post vaccine: Difficulty breathing  Swelling of face and throat  A fast heartbeat  A bad rash all over body  Dizziness and weakness   Immunizations Administered     Name Date Dose VIS Date Route   PFIZER Comrnaty(Gray TOP) Covid-19 Vaccine 08/10/2021  3:45 PM 0.3 mL 11/30/2020 Intramuscular   Manufacturer: [employer], [employer]   Lot: [id number]   NDC: [phone]

## 2021-09-03 ENCOUNTER — Other Ambulatory Visit (HOSPITAL_BASED_OUTPATIENT_CLINIC_OR_DEPARTMENT_OTHER): Payer: Self-pay

## 2021-09-04 ENCOUNTER — Other Ambulatory Visit (HOSPITAL_BASED_OUTPATIENT_CLINIC_OR_DEPARTMENT_OTHER): Payer: Self-pay | Admitting: Nurse Practitioner

## 2021-09-04 ENCOUNTER — Other Ambulatory Visit (HOSPITAL_BASED_OUTPATIENT_CLINIC_OR_DEPARTMENT_OTHER): Payer: Self-pay

## 2021-09-05 ENCOUNTER — Other Ambulatory Visit (HOSPITAL_BASED_OUTPATIENT_CLINIC_OR_DEPARTMENT_OTHER): Payer: Self-pay | Admitting: Nurse Practitioner

## 2021-09-05 ENCOUNTER — Other Ambulatory Visit (HOSPITAL_BASED_OUTPATIENT_CLINIC_OR_DEPARTMENT_OTHER): Payer: Self-pay

## 2021-09-05 MED FILL — Hydrochlorothiazide Tab 12.5 MG: ORAL | 30 days supply | Qty: 30 | Fill #0 | Status: AC

## 2021-09-06 ENCOUNTER — Other Ambulatory Visit (HOSPITAL_BASED_OUTPATIENT_CLINIC_OR_DEPARTMENT_OTHER): Payer: Self-pay

## 2021-09-06 MED ORDER — PANTOPRAZOLE SODIUM 40 MG PO TBEC
DELAYED_RELEASE_TABLET | ORAL | 6 refills | Status: DC
  Filled 2021-09-06: qty 30, 30d supply, fill #0

## 2021-09-11 ENCOUNTER — Other Ambulatory Visit (HOSPITAL_BASED_OUTPATIENT_CLINIC_OR_DEPARTMENT_OTHER): Payer: Self-pay

## 2021-09-12 ENCOUNTER — Other Ambulatory Visit (HOSPITAL_BASED_OUTPATIENT_CLINIC_OR_DEPARTMENT_OTHER): Payer: Self-pay

## 2021-09-12 DIAGNOSIS — I10 Essential (primary) hypertension: Secondary | ICD-10-CM | POA: Diagnosis not present

## 2021-09-12 DIAGNOSIS — E782 Mixed hyperlipidemia: Secondary | ICD-10-CM | POA: Diagnosis not present

## 2021-09-12 MED ORDER — LOSARTAN POTASSIUM 25 MG PO TABS
25.0000 mg | ORAL_TABLET | Freq: Every day | ORAL | 0 refills | Status: DC
  Filled 2021-09-12: qty 90, 90d supply, fill #0

## 2021-09-14 ENCOUNTER — Other Ambulatory Visit (HOSPITAL_BASED_OUTPATIENT_CLINIC_OR_DEPARTMENT_OTHER): Payer: Self-pay

## 2021-09-14 MED ORDER — ROSUVASTATIN CALCIUM 10 MG PO TABS
10.0000 mg | ORAL_TABLET | Freq: Every day | ORAL | 2 refills | Status: DC
  Filled 2021-09-14: qty 30, 30d supply, fill #0
  Filled 2021-10-08: qty 30, 30d supply, fill #1
  Filled 2021-11-08: qty 30, 30d supply, fill #2

## 2021-09-21 DIAGNOSIS — R7309 Other abnormal glucose: Secondary | ICD-10-CM | POA: Diagnosis not present

## 2021-09-25 ENCOUNTER — Ambulatory Visit (HOSPITAL_BASED_OUTPATIENT_CLINIC_OR_DEPARTMENT_OTHER): Payer: 59 | Admitting: Family Medicine

## 2021-09-25 ENCOUNTER — Ambulatory Visit (HOSPITAL_BASED_OUTPATIENT_CLINIC_OR_DEPARTMENT_OTHER): Payer: 59 | Admitting: Nurse Practitioner

## 2021-10-08 ENCOUNTER — Other Ambulatory Visit (HOSPITAL_BASED_OUTPATIENT_CLINIC_OR_DEPARTMENT_OTHER): Payer: Self-pay

## 2021-10-12 ENCOUNTER — Other Ambulatory Visit (HOSPITAL_BASED_OUTPATIENT_CLINIC_OR_DEPARTMENT_OTHER): Payer: Self-pay

## 2021-10-12 DIAGNOSIS — I10 Essential (primary) hypertension: Secondary | ICD-10-CM | POA: Diagnosis not present

## 2021-10-12 DIAGNOSIS — R079 Chest pain, unspecified: Secondary | ICD-10-CM | POA: Diagnosis not present

## 2021-10-12 MED ORDER — LOSARTAN POTASSIUM 50 MG PO TABS
50.0000 mg | ORAL_TABLET | Freq: Every day | ORAL | 1 refills | Status: DC
  Filled 2021-10-12: qty 90, 90d supply, fill #0
  Filled 2022-01-16: qty 90, 90d supply, fill #1

## 2021-10-12 MED ORDER — HYDROCHLOROTHIAZIDE 12.5 MG PO CAPS
12.5000 mg | ORAL_CAPSULE | Freq: Every day | ORAL | 1 refills | Status: DC
  Filled 2021-10-12: qty 90, 90d supply, fill #0

## 2021-10-16 ENCOUNTER — Encounter (HOSPITAL_BASED_OUTPATIENT_CLINIC_OR_DEPARTMENT_OTHER): Payer: Self-pay | Admitting: Emergency Medicine

## 2021-10-16 ENCOUNTER — Other Ambulatory Visit: Payer: Self-pay

## 2021-10-16 ENCOUNTER — Emergency Department (HOSPITAL_BASED_OUTPATIENT_CLINIC_OR_DEPARTMENT_OTHER): Payer: 59 | Admitting: Radiology

## 2021-10-16 DIAGNOSIS — I10 Essential (primary) hypertension: Secondary | ICD-10-CM | POA: Diagnosis not present

## 2021-10-16 DIAGNOSIS — Z79899 Other long term (current) drug therapy: Secondary | ICD-10-CM | POA: Insufficient documentation

## 2021-10-16 DIAGNOSIS — R0789 Other chest pain: Secondary | ICD-10-CM | POA: Diagnosis not present

## 2021-10-16 DIAGNOSIS — R079 Chest pain, unspecified: Secondary | ICD-10-CM | POA: Diagnosis not present

## 2021-10-16 LAB — CBC
HCT: 40.8 % (ref 39.0–52.0)
Hemoglobin: 14.7 g/dL (ref 13.0–17.0)
MCH: 32.7 pg (ref 26.0–34.0)
MCHC: 36 g/dL (ref 30.0–36.0)
MCV: 90.7 fL (ref 80.0–100.0)
Platelets: 255 10*3/uL (ref 150–400)
RBC: 4.5 MIL/uL (ref 4.22–5.81)
RDW: 12.7 % (ref 11.5–15.5)
WBC: 7 10*3/uL (ref 4.0–10.5)
nRBC: 0 % (ref 0.0–0.2)

## 2021-10-16 LAB — BASIC METABOLIC PANEL
Anion gap: 10 (ref 5–15)
BUN: 11 mg/dL (ref 8–23)
CO2: 24 mmol/L (ref 22–32)
Calcium: 9.4 mg/dL (ref 8.9–10.3)
Chloride: 104 mmol/L (ref 98–111)
Creatinine, Ser: 1.05 mg/dL (ref 0.61–1.24)
GFR, Estimated: >60 mL/min (ref >60)
Glucose, Bld: 115 mg/dL — ABNORMAL HIGH (ref 70–99)
Potassium: 3.5 mmol/L (ref 3.5–5.1)
Sodium: 138 mmol/L (ref 135–145)

## 2021-10-16 LAB — TROPONIN I (HIGH SENSITIVITY): Troponin I (High Sensitivity): 3 ng/L (ref <18)

## 2021-10-16 NOTE — ED Triage Notes (Signed)
Reports central chest tightness for about a week worse the last few days.  Denies any SOB or n/v.

## 2021-10-17 ENCOUNTER — Emergency Department (HOSPITAL_BASED_OUTPATIENT_CLINIC_OR_DEPARTMENT_OTHER)
Admission: EM | Admit: 2021-10-17 | Discharge: 2021-10-17 | Disposition: A | Discharge status: Home / Self Care | Payer: 59 | Attending: Emergency Medicine | Admitting: Emergency Medicine

## 2021-10-17 DIAGNOSIS — I10 Essential (primary) hypertension: Secondary | ICD-10-CM

## 2021-10-17 DIAGNOSIS — R0789 Other chest pain: Secondary | ICD-10-CM

## 2021-10-17 DIAGNOSIS — Z79899 Other long term (current) drug therapy: Secondary | ICD-10-CM | POA: Diagnosis not present

## 2021-10-17 HISTORY — DX: Essential (primary) hypertension: I10

## 2021-10-17 LAB — TROPONIN I (HIGH SENSITIVITY): Troponin I (High Sensitivity): 4 ng/L (ref <18)

## 2021-10-17 NOTE — ED Provider Notes (Signed)
[facility] EMERGENCY Provider Note: Lowella Dell, MD, FACEP  CSN: [id number] MRN: [id number] ARRIVAL: 10/16/21 at 2104 ROOM: DB015/DB015   CHIEF COMPLAINT  Chest Pain   HISTORY OF PRESENT ILLNESS  10/17/21 3:30 AM Todd Herrera is a 65 y.o. male who has recently been placed on losartan due to hypertension not adequately controlled with hydrochlorothiazide.  He is also on Protonix.  He is here now with chest discomfort for the past week.  The chest discomfort is described as a sensation of muscle tightness on the inside of his chest and not an actual pain.  He rates it as an 8 out of 10.  It occurs in the evenings after eating dinner.  He has not noticed any change with position (standing, sitting or lying).  Nothing else makes it better or worse.  He has had no associated shortness of breath, nausea, abdominal pain or diaphoresis.  He started reading about the side effects of the medications he is on and became concerned that this may be a side effect of medication or may be heart pain associated with hypertension.   Past Medical History:  Diagnosis Date   Hypertension     Past Surgical History:  Procedure Laterality Date   leg surgery      No family history on file.  Social History   Tobacco Use   Smoking status: Never   Smokeless tobacco: Never  Vaping Use   Vaping Use: Never used  Substance Use Topics   Alcohol use: Yes   Drug use: No    Prior to Admission medications   Medication Sig Start Date End Date Taking? Authorizing Provider  Blood Pressure Monitoring (OMRON 3 SERIES BP MONITOR) DEVI Use as directed to check blood pressure 08/09/21   Sandria Manly, [facility]  hydrochlorothiazide (MICROZIDE) 12.5 MG capsule Take 1 capsule (12.5 mg total) by mouth daily. 10/12/21     lidocaine (XYLOCAINE) 2 % solution Use as directed 15 mLs in the mouth or throat as needed for mouth pain. 07/05/20   Nira Conn, MD  losartan (COZAAR) 50 MG tablet Take 1 tablet (50 mg total)  by mouth daily. 10/12/21     pantoprazole (PROTONIX) 40 MG tablet 1 tablet Orally Once a day 30 day(s) 08/03/21     rosuvastatin (CRESTOR) 10 MG tablet Take 1 tablet (10 mg total) by mouth daily. 09/14/21       Allergies Patient has no known allergies.   REVIEW OF SYSTEMS  Negative except as noted here or in the History of Present Illness.   PHYSICAL EXAMINATION  Initial Vital Signs Blood pressure (!) 161/95, pulse 79, temperature 98.3 F (36.8 C), temperature source Oral, resp. rate 11, height 5' 10.5" (1.791 m), weight 95.3 kg, SpO2 100 %.  Examination General: Well-developed, well-nourished male in no acute distress; appearance consistent with age of record HENT: normocephalic; atraumatic Eyes: Normal appearance Neck: supple Heart: regular rate and rhythm Lungs: clear to auscultation bilaterally Abdomen: soft; nondistended; nontender; bowel sounds present Extremities: No deformity; full range of motion; trace edema of lower legs Neurologic: Awake, alert and oriented; motor function intact in all extremities and symmetric; no facial droop Skin: Warm and dry Psychiatric: Normal mood and affect   RESULTS  Summary of this visit's results, reviewed and interpreted by myself:  EKG Interpretation:  Date & Time: 10/16/2021 9:18 AM  Rate: 80  Rhythm: normal sinus rhythm  QRS Axis: normal  Intervals: normal  ST/T Wave abnormalities: normal  Conduction Disutrbances:none  Narrative Interpretation:   Old EKG Reviewed: unchanged  Laboratory Studies: Results for orders placed or performed during the hospital encounter of 10/17/21 (from the past 24 hour(s))  Basic metabolic panel     Status: Abnormal   Collection Time: 10/16/21  9:14 PM  Result Value Ref Range   Sodium 138 135 - 145 mmol/L   Potassium 3.5 3.5 - 5.1 mmol/L   Chloride 104 98 - 111 mmol/L   CO2 24 22 - 32 mmol/L   Glucose, Bld 115 (H) 70 - 99 mg/dL   BUN 11 8 - 23 mg/dL   Creatinine, Ser 5.32 0.61 - 1.24 mg/dL    Calcium 9.4 8.9 - 99.2 mg/dL   GFR, Estimated >42 >68 mL/min   Anion gap 10 5 - 15  CBC     Status: None   Collection Time: 10/16/21  9:14 PM  Result Value Ref Range   WBC 7.0 4.0 - 10.5 K/uL   RBC 4.50 4.22 - 5.81 MIL/uL   Hemoglobin 14.7 13.0 - 17.0 g/dL   HCT 34.1 96.2 - 22.9 %   MCV 90.7 80.0 - 100.0 fL   MCH 32.7 26.0 - 34.0 pg   MCHC 36.0 30.0 - 36.0 g/dL   RDW 79.8 92.1 - 19.4 %   Platelets 255 150 - 400 K/uL   nRBC 0.0 0.0 - 0.2 %  Troponin I (High Sensitivity)     Status: None   Collection Time: 10/16/21  9:14 PM  Result Value Ref Range   Troponin I (High Sensitivity) 3 <18 ng/L  Troponin I (High Sensitivity)     Status: None   Collection Time: 10/17/21  3:30 AM  Result Value Ref Range   Troponin I (High Sensitivity) 4 <18 ng/L   Imaging Studies: DG Chest 2 View  Result Date: 10/16/2021 CLINICAL DATA:  Chest pain. EXAM: CHEST - 2 VIEW COMPARISON:  Chest x-ray 08/08/2021. FINDINGS: The heart size and mediastinal contours are within normal limits. There is a questionable small nodular density in the right upper lobe. The lungs are otherwise clear. There is no pleural effusion or pneumothorax. The visualized skeletal structures are unremarkable. IMPRESSION: 1. No acute cardiopulmonary process. 2. Questionable small nodular density in the right upper lobe. Recommend follow-up nonemergent chest CT. Electronically Signed   By: Darliss Cheney M.D.   On: 10/16/2021 21:53    ED COURSE and MDM  Nursing notes, initial and subsequent vitals signs, including pulse oximetry, reviewed and interpreted by myself.  Vitals:   10/16/21 2122 10/16/21 2123 10/17/21 0322  BP: (!) 150/101  (!) 161/95  Pulse: 83  79  Resp: 18  11  Temp: 98.3 F (36.8 C)    TempSrc: Oral    SpO2: 97%  100%  Weight:  95.3 kg   Height:  5' 10.5" (1.791 m)    Medications - No data to display  The patient's chest pain is atypical for cardiac etiology.  Is more consistent with acid reflux although he is  already on Protonix.  It may be related to his recent change in medications and he has already contacted his PCP and expects to talk to her later today.  He also has an appointment with cardiology next week.  I do not believe any further testing is indicated in the ED at this time.  BP down to 156/94 at this time.  PROCEDURES  Procedures   ED DIAGNOSES     ICD-10-CM   1. Atypical chest pain  R07.89  2. Hypertension not at goal  I10          Paula Zietz, Jonny Ruiz, MD 10/17/21 [id number]

## 2021-10-24 ENCOUNTER — Ambulatory Visit: Payer: 59 | Admitting: Internal Medicine

## 2021-10-24 ENCOUNTER — Encounter: Payer: Self-pay | Admitting: Internal Medicine

## 2021-10-24 ENCOUNTER — Other Ambulatory Visit: Payer: Self-pay

## 2021-10-24 ENCOUNTER — Other Ambulatory Visit (HOSPITAL_BASED_OUTPATIENT_CLINIC_OR_DEPARTMENT_OTHER): Payer: Self-pay

## 2021-10-24 VITALS — BP 156/100 | HR 76 | Ht 70.0 in | Wt 207.2 lb

## 2021-10-24 DIAGNOSIS — I739 Peripheral vascular disease, unspecified: Secondary | ICD-10-CM | POA: Diagnosis not present

## 2021-10-24 DIAGNOSIS — I10 Essential (primary) hypertension: Secondary | ICD-10-CM

## 2021-10-24 MED ORDER — HYDROCHLOROTHIAZIDE 25 MG PO TABS
25.0000 mg | ORAL_TABLET | Freq: Every day | ORAL | 3 refills | Status: DC
  Filled 2021-10-24: qty 90, 90d supply, fill #0
  Filled 2022-01-17 – 2022-03-14 (×2): qty 90, 90d supply, fill #1
  Filled 2022-06-24: qty 90, 90d supply, fill #2
  Filled 2022-10-02: qty 90, 90d supply, fill #3

## 2021-10-24 NOTE — Progress Notes (Signed)
Cardiology Office Note:    Date:  10/24/2021   ID:  Todd Herrera, DOB 06-14-1956, MRN [id number]  PCP:  Gillian Scarce   [facility] Providers Cardiologist:  None     Referring MD: Piedad Climes, [place], *   No chief complaint on file. Hypertension  History of Present Illness:    Todd Herrera is a 65 y.o. male with a hx of htn referral from the ED for atypical CP and  abnormal EKG  He notes he was diagnosed with htn. His blood pressure was doing ok then increased up until he went to the ED. He went to the ED 10/17/2021, he noted that his chest discomfort is described as a sensation of muscle tightness on the inside of his chest and not an actual pain.  With the chest tightness symptoms he thinks he was panicking. He rates it as an 8 out of 10.  It occurs in the evenings after eating dinner. His vitals included BP 150s-160s/101-95. Otherwise normal. EKG showed NSR with non specific st-t changes. Troponins were negative. He notes he gets tightness in the chest with eating. He was consuming some caffeine. It correlates with drinking coffee. He is working on an improved diet.  At home it is 120s-140s. TSH 2.7. He denies dyspnea. No LE edema. He feels deconditioned. He noted on checkout after reading symptoms on the door about PAD that he was having symptoms of leg pain.  CVD Risk/Equivalent: HLD- on crestor HTN- yes PAD- No DMII - no Smoker-No Stroke-No Premature Family History- unknown   Past Medical History:  Diagnosis Date   Hypertension     Past Surgical History:  Procedure Laterality Date   leg surgery      Current Medications: No outpatient medications have been marked as taking for the 10/24/21 encounter (Appointment) with Maisie Fus, MD.     Allergies:   Patient has no known allergies.   Social History   Socioeconomic History   Marital status: Married    Spouse name: Not on file   Number of children: Not on file   Years of education:  Not on file   Highest education level: Not on file  Occupational History   Not on file  Tobacco Use   Smoking status: Never   Smokeless tobacco: Never  Vaping Use   Vaping Use: Never used  Substance and Sexual Activity   Alcohol use: Yes   Drug use: No   Sexual activity: Not on file  Other Topics Concern   Not on file  Social History Narrative   Not on file   Social Determinants of Health   Financial Resource Strain: Not on file  Food Insecurity: Not on file  Transportation Needs: Not on file  Physical Activity: Not on file  Stress: Not on file  Social Connections: Not on file     Family History: The patient's family c/w cancer history  ROS:   Please see the history of present illness.     All other systems reviewed and are negative.  EKGs/Labs/Other Studies Reviewed:    The following studies were reviewed today:   EKG:  EKG is  ordered today.  The ekg ordered today demonstrates   NSR, no ischemic changes    Recent Labs: 10/16/2021: BUN 11; Creatinine, Ser 1.05; Hemoglobin 14.7; Platelets 255; Potassium 3.5; Sodium 138  Recent Lipid Panel No results found for: CHOL, TRIG, HDL, CHOLHDL, VLDL, LDLCALC, LDLDIRECT   Risk Assessment/Calculations:  The 10-year ASCVD risk score (Arnett DK, et al., 2019) is: 23.8%   Values used to calculate the score:     Age: 47 years     Sex: Male     Is Non-Hispanic African American: Yes     Diabetic: No     Tobacco smoker: No     Systolic Blood Pressure: 156 mmHg     Is BP treated: Yes     HDL Cholesterol: 41 MG/DL     Total Cholesterol: 225 MG/DL       Physical Exam:    VS:  Vitals:   10/24/21 1052 10/24/21 1058  BP: (!) 156/102 (!) 156/100  Pulse: 76   SpO2: 97%      Wt Readings from Last 3 Encounters:  10/16/21 210 lb (95.3 kg)  08/08/21 205 lb (93 kg)  07/05/20 214 lb 15.2 oz (97.5 kg)     GEN:  Well nourished, well developed in no acute distress.  HEENT: Normal NECK: No JVD; No carotid  bruits CARDIAC: RRR, no murmurs, rubs, gallops RESPIRATORY:  Clear to auscultation without rales, wheezing or rhonchi  ABDOMEN: Soft, non-tender, non-distended MUSCULOSKELETAL:  No edema; No deformity  SKIN: Warm and dry NEUROLOGIC:  Alert and oriented x 3 PSYCHIATRIC:  Normal affect   ASSESSMENT:    #Atypical CP/CVD risk: His symptoms correlate with caffeine. Can be related to reflux. He has no exertional cp. He does have high risk with ASCVD 23.8%. He is on crestor therapy. Can recheck Lipid profile 6-8 weeks after starting crestor to assess if LDL is at goal < 100 mg/dL. If not, can add zetia and recheck again. We discussed lifestyle modifications with diet (gave him information on  the DASH diet), avoiding triggers for reflux, increasing physical activity.   #HTN: recommended HCTz 25 mg; he will likely need to increase his losartan with high diastolic BP. He would like to trial lifestyle. If Bps not at goal < 130/80 mg can increase the losartan. If maxed on HCTz and losartan, can include norvasc if BP is not at goal.  #Leg pain: with reported leg pain ordered ABIs to be done on a return visit.  PLAN:    In order of problems listed above:  Increase HCTz to 25 mg PRN follow up if new cardiac symptoms arise If ABI positive will obtain arterial duplex and refer to the peripheral vascular specialists   Medication Adjustments/Labs and Tests Ordered: Current medicines are reviewed at length with the patient today.  Concerns regarding medicines are outlined above.   Signed, Maisie Fus, MD  10/24/2021 7:03 AM    [facility]

## 2021-10-24 NOTE — Patient Instructions (Addendum)
Medication Instructions:  Increase Hydrochlorothiazide to 25 mg daily  *If you need a refill on your cardiac medications before your next appointment, please call your pharmacy*   Lab Work: None ordered today   Testing/Procedures: Your physician has requested that you have a lower extremity arterial exercise duplex. During this test, exercise and ultrasound are used to evaluate arterial blood flow in the legs. Allow one hour for this exam. There are no restrictions or special instructions.  [address]  Your physician has requested that you have an ankle brachial index (ABI). During this test an ultrasound and blood pressure cuff are used to evaluate the arteries that supply the arms and legs with blood. Allow thirty minutes for this exam. There are no restrictions or special instructions. [address]   Follow-Up: At [facility], you and your health needs are our priority.  As part of our continuing mission to provide you with exceptional heart care, we have created designated Provider Care Teams.  These Care Teams include your primary Cardiologist (physician) and Advanced Practice Providers (APPs -  Physician Assistants and Nurse Practitioners) who all work together to provide you with the care you need, when you need it.  We recommend signing up for the patient portal called "MyChart".  Sign up information is provided on this After Visit Summary.  MyChart is used to connect with patients for Virtual Visits (Telemedicine).  Patients are able to view lab/test results, encounter notes, upcoming appointments, etc.  Non-urgent messages can be sent to your provider as well.   To learn more about what you can do with MyChart, go to [email or web address].    Your next appointment:   As needed   The format for your next appointment:   In Person  Provider:   Carolan Clines, MD    DASH Eating Plan DASH stands for Dietary Approaches to Stop  Hypertension. The DASH eating plan is a healthy eating plan that has been shown to: Reduce high blood pressure (hypertension). Reduce your risk for type 2 diabetes, heart disease, and stroke. Help with weight loss. What are tips for following this plan? Reading food labels Check food labels for the amount of salt (sodium) per serving. Choose foods with less than 5 percent of the Daily Value of sodium. Generally, foods with less than 300 milligrams (mg) of sodium per serving fit into this eating plan. To find whole grains, look for the word "whole" as the first word in the ingredient list. Shopping Buy products labeled as "low-sodium" or "no salt added." Buy fresh foods. Avoid canned foods and pre-made or frozen meals. Cooking Avoid adding salt when cooking. Use salt-free seasonings or herbs instead of table salt or sea salt. Check with your health care provider or pharmacist before using salt substitutes. Do not fry foods. Cook foods using healthy methods such as baking, boiling, grilling, roasting, and broiling instead. Cook with heart-healthy oils, such as olive, canola, avocado, soybean, or sunflower oil. Meal planning  Eat a balanced diet that includes: 4 or more servings of fruits and 4 or more servings of vegetables each day. Try to fill one-half of your plate with fruits and vegetables. 6-8 servings of whole grains each day. Less than 6 oz (170 g) of lean meat, poultry, or fish each day. A 3-oz (85-g) serving of meat is about the same size as a deck of cards. One egg equals 1 oz (28 g). 2-3 servings of low-fat dairy each day. One serving  is 1 cup (237 mL). 1 serving of nuts, seeds, or beans 5 times each week. 2-3 servings of heart-healthy fats. Healthy fats called omega-3 fatty acids are found in foods such as walnuts, flaxseeds, fortified milks, and eggs. These fats are also found in cold-water fish, such as sardines, salmon, and mackerel. Limit how much you eat of: Canned or  prepackaged foods. Food that is high in trans fat, such as some fried foods. Food that is high in saturated fat, such as fatty meat. Desserts and other sweets, sugary drinks, and other foods with added sugar. Full-fat dairy products. Do not salt foods before eating. Do not eat more than 4 egg yolks a week. Try to eat at least 2 vegetarian meals a week. Eat more home-cooked food and less restaurant, buffet, and fast food. Lifestyle When eating at a restaurant, ask that your food be prepared with less salt or no salt, if possible. If you drink alcohol: Limit how much you use to: 0-1 drink a day for women who are not pregnant. 0-2 drinks a day for men. Be aware of how much alcohol is in your drink. In the [place] one drink equals one 12 oz bottle of beer (355 mL), one 5 oz glass of wine (148 mL), or one 1 oz glass of hard liquor (44 mL). General information Avoid eating more than 2,300 mg of salt a day. If you have hypertension, you may need to reduce your sodium intake to 1,500 mg a day. Work with your health care provider to maintain a healthy body weight or to lose weight. Ask what an ideal weight is for you. Get at least 30 minutes of exercise that causes your heart to beat faster (aerobic exercise) most days of the week. Activities may include walking, swimming, or biking. Work with your health care provider or dietitian to adjust your eating plan to your individual calorie needs. What foods should I eat? Fruits All fresh, dried, or frozen fruit. Canned fruit in natural juice (without added sugar). Vegetables Fresh or frozen vegetables (raw, steamed, roasted, or grilled). Low-sodium or reduced-sodium tomato and vegetable juice. Low-sodium or reduced-sodium tomato sauce and tomato paste. Low-sodium or reduced-sodium canned vegetables. Grains Whole-grain or whole-wheat bread. Whole-grain or whole-wheat pasta. Brown rice. Orpah Cobb. Bulgur. Whole-grain and low-sodium cereals. Pita  bread. Low-fat, low-sodium crackers. Whole-wheat flour tortillas. Meats and other proteins Skinless chicken or [place]. Ground chicken or [place]. Pork with fat trimmed off. Fish and seafood. Egg whites. Dried beans, peas, or lentils. Unsalted nuts, nut butters, and seeds. Unsalted canned beans. Lean cuts of beef with fat trimmed off. Low-sodium, lean precooked or cured meat, such as sausages or meat loaves. Dairy Low-fat (1%) or fat-free (skim) milk. Reduced-fat, low-fat, or fat-free cheeses. Nonfat, low-sodium ricotta or cottage cheese. Low-fat or nonfat yogurt. Low-fat, low-sodium cheese. Fats and oils Soft margarine without trans fats. Vegetable oil. Reduced-fat, low-fat, or light mayonnaise and salad dressings (reduced-sodium). Canola, safflower, olive, avocado, soybean, and sunflower oils. Avocado. Seasonings and condiments Herbs. Spices. Seasoning mixes without salt. Other foods Unsalted popcorn and pretzels. Fat-free sweets. The items listed above may not be a complete list of foods and beverages you can eat. Contact a dietitian for more information. What foods should I avoid? Fruits Canned fruit in a light or heavy syrup. Fried fruit. Fruit in cream or butter sauce. Vegetables Creamed or fried vegetables. Vegetables in a cheese sauce. Regular canned vegetables (not low-sodium or reduced-sodium). Regular canned tomato sauce and paste (not low-sodium or  reduced-sodium). Regular tomato and vegetable juice (not low-sodium or reduced-sodium). Rosita Fire. Olives. Grains Baked goods made with fat, such as croissants, muffins, or some breads. Dry pasta or rice meal packs. Meats and other proteins Fatty cuts of meat. Ribs. Fried meat. Tomasa Blase. Bologna, salami, and other precooked or cured meats, such as sausages or meat loaves. Fat from the back of a pig (fatback). Bratwurst. Salted nuts and seeds. Canned beans with added salt. Canned or smoked fish. Whole eggs or egg yolks. Chicken or [place] with  skin. Dairy Whole or 2% milk, cream, and half-and-half. Whole or full-fat cream cheese. Whole-fat or sweetened yogurt. Full-fat cheese. Nondairy creamers. Whipped toppings. Processed cheese and cheese spreads. Fats and oils Butter. Stick margarine. Lard. Shortening. Ghee. Bacon fat. Tropical oils, such as coconut, palm kernel, or palm oil. Seasonings and condiments Onion salt, garlic salt, seasoned salt, table salt, and sea salt. Worcestershire sauce. Tartar sauce. Barbecue sauce. Teriyaki sauce. Soy sauce, including reduced-sodium. Steak sauce. Canned and packaged gravies. Fish sauce. Oyster sauce. Cocktail sauce. Store-bought horseradish. Ketchup. Mustard. Meat flavorings and tenderizers. Bouillon cubes. Hot sauces. Pre-made or packaged marinades. Pre-made or packaged taco seasonings. Relishes. Regular salad dressings. Other foods Salted popcorn and pretzels. The items listed above may not be a complete list of foods and beverages you should avoid. Contact a dietitian for more information. Where to find more information National Heart, Lung, and Blood Institute: PopSteam.is American Heart Association: www.heart.org Academy of Nutrition and Dietetics: www.eatright.org National Kidney Foundation: www.kidney.org Summary The DASH eating plan is a healthy eating plan that has been shown to reduce high blood pressure (hypertension). It may also reduce your risk for type 2 diabetes, heart disease, and stroke. When on the DASH eating plan, aim to eat more fresh fruits and vegetables, whole grains, lean proteins, low-fat dairy, and heart-healthy fats. With the DASH eating plan, you should limit salt (sodium) intake to 2,300 mg a day. If you have hypertension, you may need to reduce your sodium intake to 1,500 mg a day. Work with your health care provider or dietitian to adjust your eating plan to your individual calorie needs. This information is not intended to replace advice given to you by your  health care provider. Make sure you discuss any questions you have with your health care provider. Document Revised: 11/12/2019 Document Reviewed: 11/12/2019 Elsevier Patient Education  2022 [employer].

## 2021-10-26 ENCOUNTER — Other Ambulatory Visit (HOSPITAL_BASED_OUTPATIENT_CLINIC_OR_DEPARTMENT_OTHER): Payer: Self-pay

## 2021-10-26 DIAGNOSIS — K219 Gastro-esophageal reflux disease without esophagitis: Secondary | ICD-10-CM | POA: Diagnosis not present

## 2021-10-26 MED ORDER — FAMOTIDINE 40 MG PO TABS
40.0000 mg | ORAL_TABLET | Freq: Every day | ORAL | 1 refills | Status: DC
  Filled 2021-10-26: qty 30, 30d supply, fill #0
  Filled 2021-11-21: qty 30, 30d supply, fill #1
  Filled 2021-11-23: qty 30, 30d supply, fill #0

## 2021-11-08 ENCOUNTER — Other Ambulatory Visit (HOSPITAL_BASED_OUTPATIENT_CLINIC_OR_DEPARTMENT_OTHER): Payer: Self-pay

## 2021-11-09 ENCOUNTER — Ambulatory Visit (HOSPITAL_COMMUNITY)
Admission: RE | Admit: 2021-11-09 | Discharge: 2021-11-09 | Disposition: A | Discharge status: Home / Self Care | Payer: 59 | Source: Ambulatory Visit | Attending: Cardiovascular Disease | Admitting: Cardiovascular Disease

## 2021-11-09 ENCOUNTER — Other Ambulatory Visit (HOSPITAL_BASED_OUTPATIENT_CLINIC_OR_DEPARTMENT_OTHER): Payer: Self-pay

## 2021-11-09 ENCOUNTER — Ambulatory Visit: Payer: 59 | Attending: Internal Medicine

## 2021-11-09 ENCOUNTER — Other Ambulatory Visit: Payer: Self-pay

## 2021-11-09 DIAGNOSIS — I739 Peripheral vascular disease, unspecified: Secondary | ICD-10-CM | POA: Diagnosis not present

## 2021-11-09 DIAGNOSIS — Z23 Encounter for immunization: Secondary | ICD-10-CM

## 2021-11-09 MED ORDER — FLUARIX QUADRIVALENT 0.5 ML IM SUSY
PREFILLED_SYRINGE | INTRAMUSCULAR | 0 refills | Status: DC
  Filled 2021-11-09: qty 0.5, 1d supply, fill #0

## 2021-11-09 MED ORDER — PFIZER COVID-19 VAC BIVALENT 30 MCG/0.3ML IM SUSP
INTRAMUSCULAR | 0 refills | Status: DC
  Filled 2021-11-09: qty 0.3, 1d supply, fill #0

## 2021-11-09 NOTE — Progress Notes (Signed)
   Covid-19 Vaccination Clinic  Name:  Todd Herrera    MRN: [id number] DOB: 01-Jul-1956  11/09/2021  [name] was observed post Covid-19 immunization for 15 minutes without incident. He was provided with Vaccine Information Sheet and instruction to access the V-Safe system.   [name] was instructed to call 911 with any severe reactions post vaccine: Difficulty breathing  Swelling of face and throat  A fast heartbeat  A bad rash all over body  Dizziness and weakness   Immunizations Administered     Name Date Dose VIS Date Route   Pfizer Covid-19 Vaccine Bivalent Booster 11/09/2021  4:07 PM 0.3 mL 08/22/2021 Intramuscular   Manufacturer: [employer], [employer]   Lot: [id number]   NDC: [phone]

## 2021-11-16 ENCOUNTER — Encounter (HOSPITAL_COMMUNITY): Payer: Self-pay | Admitting: Radiology

## 2021-11-21 ENCOUNTER — Other Ambulatory Visit (HOSPITAL_BASED_OUTPATIENT_CLINIC_OR_DEPARTMENT_OTHER): Payer: Self-pay

## 2021-11-23 ENCOUNTER — Other Ambulatory Visit (HOSPITAL_BASED_OUTPATIENT_CLINIC_OR_DEPARTMENT_OTHER): Payer: Self-pay

## 2021-12-06 ENCOUNTER — Other Ambulatory Visit (HOSPITAL_BASED_OUTPATIENT_CLINIC_OR_DEPARTMENT_OTHER): Payer: Self-pay

## 2021-12-11 ENCOUNTER — Other Ambulatory Visit (HOSPITAL_BASED_OUTPATIENT_CLINIC_OR_DEPARTMENT_OTHER): Payer: Self-pay

## 2021-12-11 MED ORDER — ROSUVASTATIN CALCIUM 10 MG PO TABS
10.0000 mg | ORAL_TABLET | Freq: Every day | ORAL | 0 refills | Status: DC
  Filled 2021-12-11: qty 30, 30d supply, fill #0

## 2021-12-14 ENCOUNTER — Other Ambulatory Visit (HOSPITAL_COMMUNITY): Payer: Self-pay

## 2021-12-26 ENCOUNTER — Other Ambulatory Visit (HOSPITAL_COMMUNITY): Payer: Self-pay

## 2021-12-26 MED ORDER — FAMOTIDINE 40 MG PO TABS
40.0000 mg | ORAL_TABLET | Freq: Every day | ORAL | 0 refills | Status: DC
  Filled 2021-12-26: qty 30, 30d supply, fill #0

## 2022-01-16 ENCOUNTER — Other Ambulatory Visit (HOSPITAL_COMMUNITY): Payer: Self-pay

## 2022-01-16 MED ORDER — ROSUVASTATIN CALCIUM 10 MG PO TABS
10.0000 mg | ORAL_TABLET | Freq: Every day | ORAL | 0 refills | Status: DC
  Filled 2022-01-16: qty 30, 30d supply, fill #0

## 2022-01-17 ENCOUNTER — Other Ambulatory Visit (HOSPITAL_COMMUNITY): Payer: Self-pay

## 2022-01-25 ENCOUNTER — Other Ambulatory Visit (HOSPITAL_COMMUNITY): Payer: Self-pay

## 2022-01-25 DIAGNOSIS — M25561 Pain in right knee: Secondary | ICD-10-CM | POA: Diagnosis not present

## 2022-01-25 MED ORDER — PREDNISONE 10 MG PO TABS
ORAL_TABLET | ORAL | 0 refills | Status: DC
  Filled 2022-01-25: qty 21, 12d supply, fill #0

## 2022-01-26 ENCOUNTER — Other Ambulatory Visit (HOSPITAL_COMMUNITY): Payer: Self-pay

## 2022-01-30 ENCOUNTER — Other Ambulatory Visit (HOSPITAL_COMMUNITY): Payer: Self-pay

## 2022-01-30 DIAGNOSIS — I10 Essential (primary) hypertension: Secondary | ICD-10-CM | POA: Diagnosis not present

## 2022-01-30 DIAGNOSIS — L309 Dermatitis, unspecified: Secondary | ICD-10-CM | POA: Diagnosis not present

## 2022-01-30 DIAGNOSIS — E782 Mixed hyperlipidemia: Secondary | ICD-10-CM | POA: Diagnosis not present

## 2022-01-30 DIAGNOSIS — R7309 Other abnormal glucose: Secondary | ICD-10-CM | POA: Diagnosis not present

## 2022-01-30 DIAGNOSIS — K219 Gastro-esophageal reflux disease without esophagitis: Secondary | ICD-10-CM | POA: Diagnosis not present

## 2022-01-30 MED ORDER — TRIAMCINOLONE ACETONIDE 0.1 % EX CREA
TOPICAL_CREAM | CUTANEOUS | 0 refills | Status: DC
  Filled 2022-01-30: qty 45, 15d supply, fill #0

## 2022-01-30 MED ORDER — FAMOTIDINE 40 MG PO TABS
40.0000 mg | ORAL_TABLET | Freq: Every day | ORAL | 1 refills | Status: DC
  Filled 2022-01-30: qty 90, 90d supply, fill #0
  Filled 2022-12-19: qty 90, 90d supply, fill #1

## 2022-01-31 ENCOUNTER — Other Ambulatory Visit (HOSPITAL_COMMUNITY): Payer: Self-pay

## 2022-02-21 ENCOUNTER — Other Ambulatory Visit (HOSPITAL_COMMUNITY): Payer: Self-pay

## 2022-02-22 ENCOUNTER — Other Ambulatory Visit (HOSPITAL_COMMUNITY): Payer: Self-pay

## 2022-02-22 MED ORDER — ROSUVASTATIN CALCIUM 10 MG PO TABS
10.0000 mg | ORAL_TABLET | Freq: Every day | ORAL | 0 refills | Status: DC
  Filled 2022-02-22: qty 90, 90d supply, fill #0

## 2022-03-14 ENCOUNTER — Other Ambulatory Visit (HOSPITAL_COMMUNITY): Payer: Self-pay

## 2022-03-18 ENCOUNTER — Other Ambulatory Visit (HOSPITAL_COMMUNITY): Payer: Self-pay

## 2022-04-01 ENCOUNTER — Other Ambulatory Visit (HOSPITAL_COMMUNITY): Payer: Self-pay

## 2022-04-02 ENCOUNTER — Other Ambulatory Visit (HOSPITAL_COMMUNITY): Payer: Self-pay

## 2022-04-03 ENCOUNTER — Other Ambulatory Visit (HOSPITAL_COMMUNITY): Payer: Self-pay

## 2022-04-04 ENCOUNTER — Other Ambulatory Visit (HOSPITAL_COMMUNITY): Payer: Self-pay

## 2022-04-05 ENCOUNTER — Other Ambulatory Visit (HOSPITAL_COMMUNITY): Payer: Self-pay

## 2022-04-05 MED ORDER — PANTOPRAZOLE SODIUM 40 MG PO TBEC
40.0000 mg | DELAYED_RELEASE_TABLET | Freq: Every day | ORAL | 6 refills | Status: DC
  Filled 2022-04-05: qty 30, 30d supply, fill #0
  Filled 2022-07-03: qty 30, 30d supply, fill #1

## 2022-04-23 ENCOUNTER — Other Ambulatory Visit (HOSPITAL_COMMUNITY): Payer: Self-pay

## 2022-04-23 MED ORDER — PEG 3350-KCL-NA BICARB-NACL 420 G PO SOLR
ORAL | 0 refills | Status: DC
  Filled 2022-04-23: qty 4000, 1d supply, fill #0

## 2022-04-29 DIAGNOSIS — R1013 Epigastric pain: Secondary | ICD-10-CM | POA: Diagnosis not present

## 2022-04-29 DIAGNOSIS — K635 Polyp of colon: Secondary | ICD-10-CM | POA: Diagnosis not present

## 2022-04-29 DIAGNOSIS — K2930 Chronic superficial gastritis without bleeding: Secondary | ICD-10-CM | POA: Diagnosis not present

## 2022-04-29 DIAGNOSIS — K2970 Gastritis, unspecified, without bleeding: Secondary | ICD-10-CM | POA: Diagnosis not present

## 2022-04-29 DIAGNOSIS — R194 Change in bowel habit: Secondary | ICD-10-CM | POA: Diagnosis not present

## 2022-04-29 DIAGNOSIS — K648 Other hemorrhoids: Secondary | ICD-10-CM | POA: Diagnosis not present

## 2022-04-29 DIAGNOSIS — K5730 Diverticulosis of large intestine without perforation or abscess without bleeding: Secondary | ICD-10-CM | POA: Diagnosis not present

## 2022-05-02 ENCOUNTER — Other Ambulatory Visit (HOSPITAL_COMMUNITY): Payer: Self-pay

## 2022-05-02 MED ORDER — LOSARTAN POTASSIUM 50 MG PO TABS
50.0000 mg | ORAL_TABLET | Freq: Every day | ORAL | 0 refills | Status: DC
  Filled 2022-05-02: qty 90, 90d supply, fill #0

## 2022-06-05 ENCOUNTER — Other Ambulatory Visit (HOSPITAL_COMMUNITY): Payer: Self-pay

## 2022-06-05 MED ORDER — ROSUVASTATIN CALCIUM 10 MG PO TABS
10.0000 mg | ORAL_TABLET | Freq: Every day | ORAL | 0 refills | Status: DC
  Filled 2022-06-05: qty 90, 90d supply, fill #0

## 2022-06-13 IMAGING — DX DG CHEST 2V
2 series · 2 of 2 positions shown · non-contrast
Comparison: None.

CLINICAL DATA: Chest pain.

EXAM:
CHEST - 2 VIEW

[chest pa]
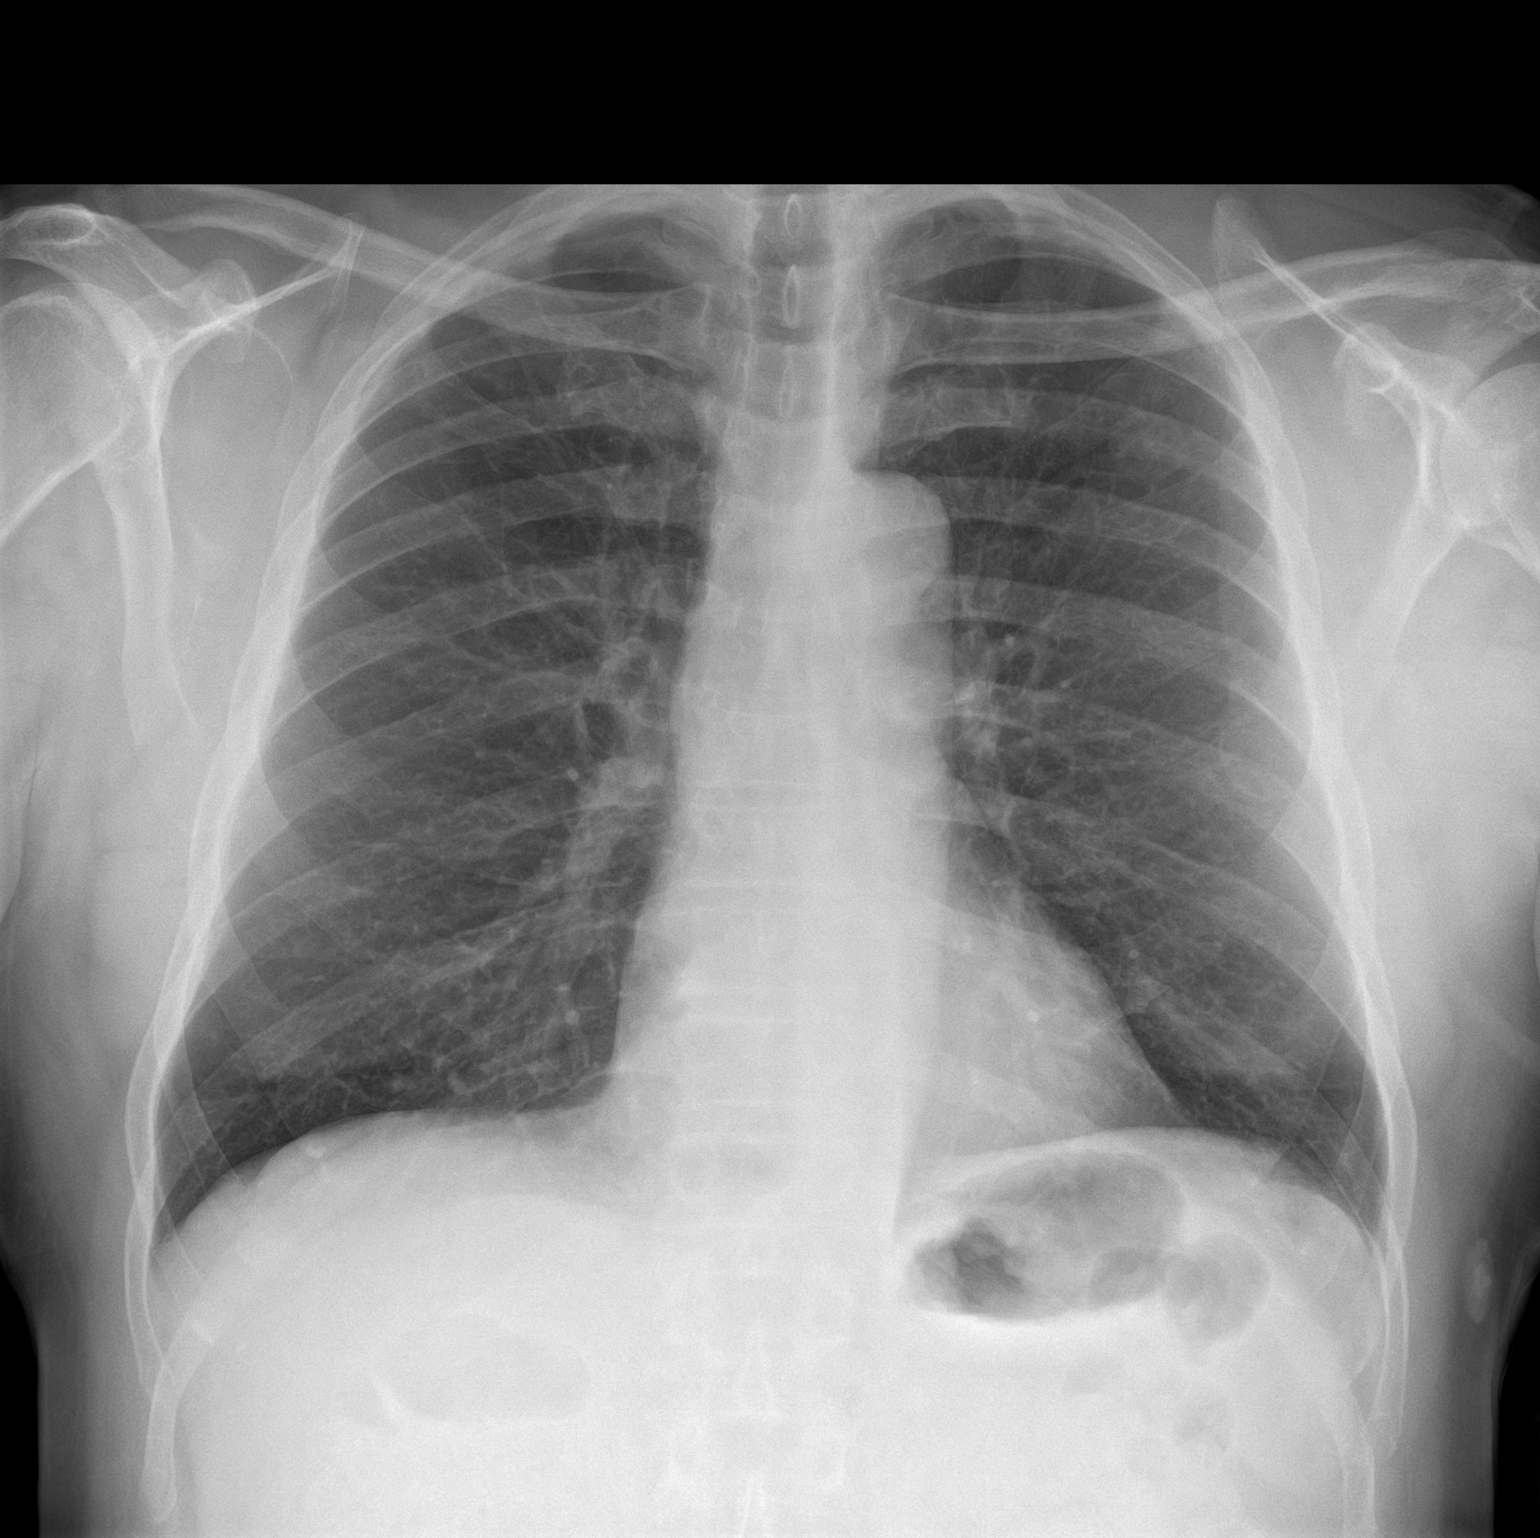

[chest lat]
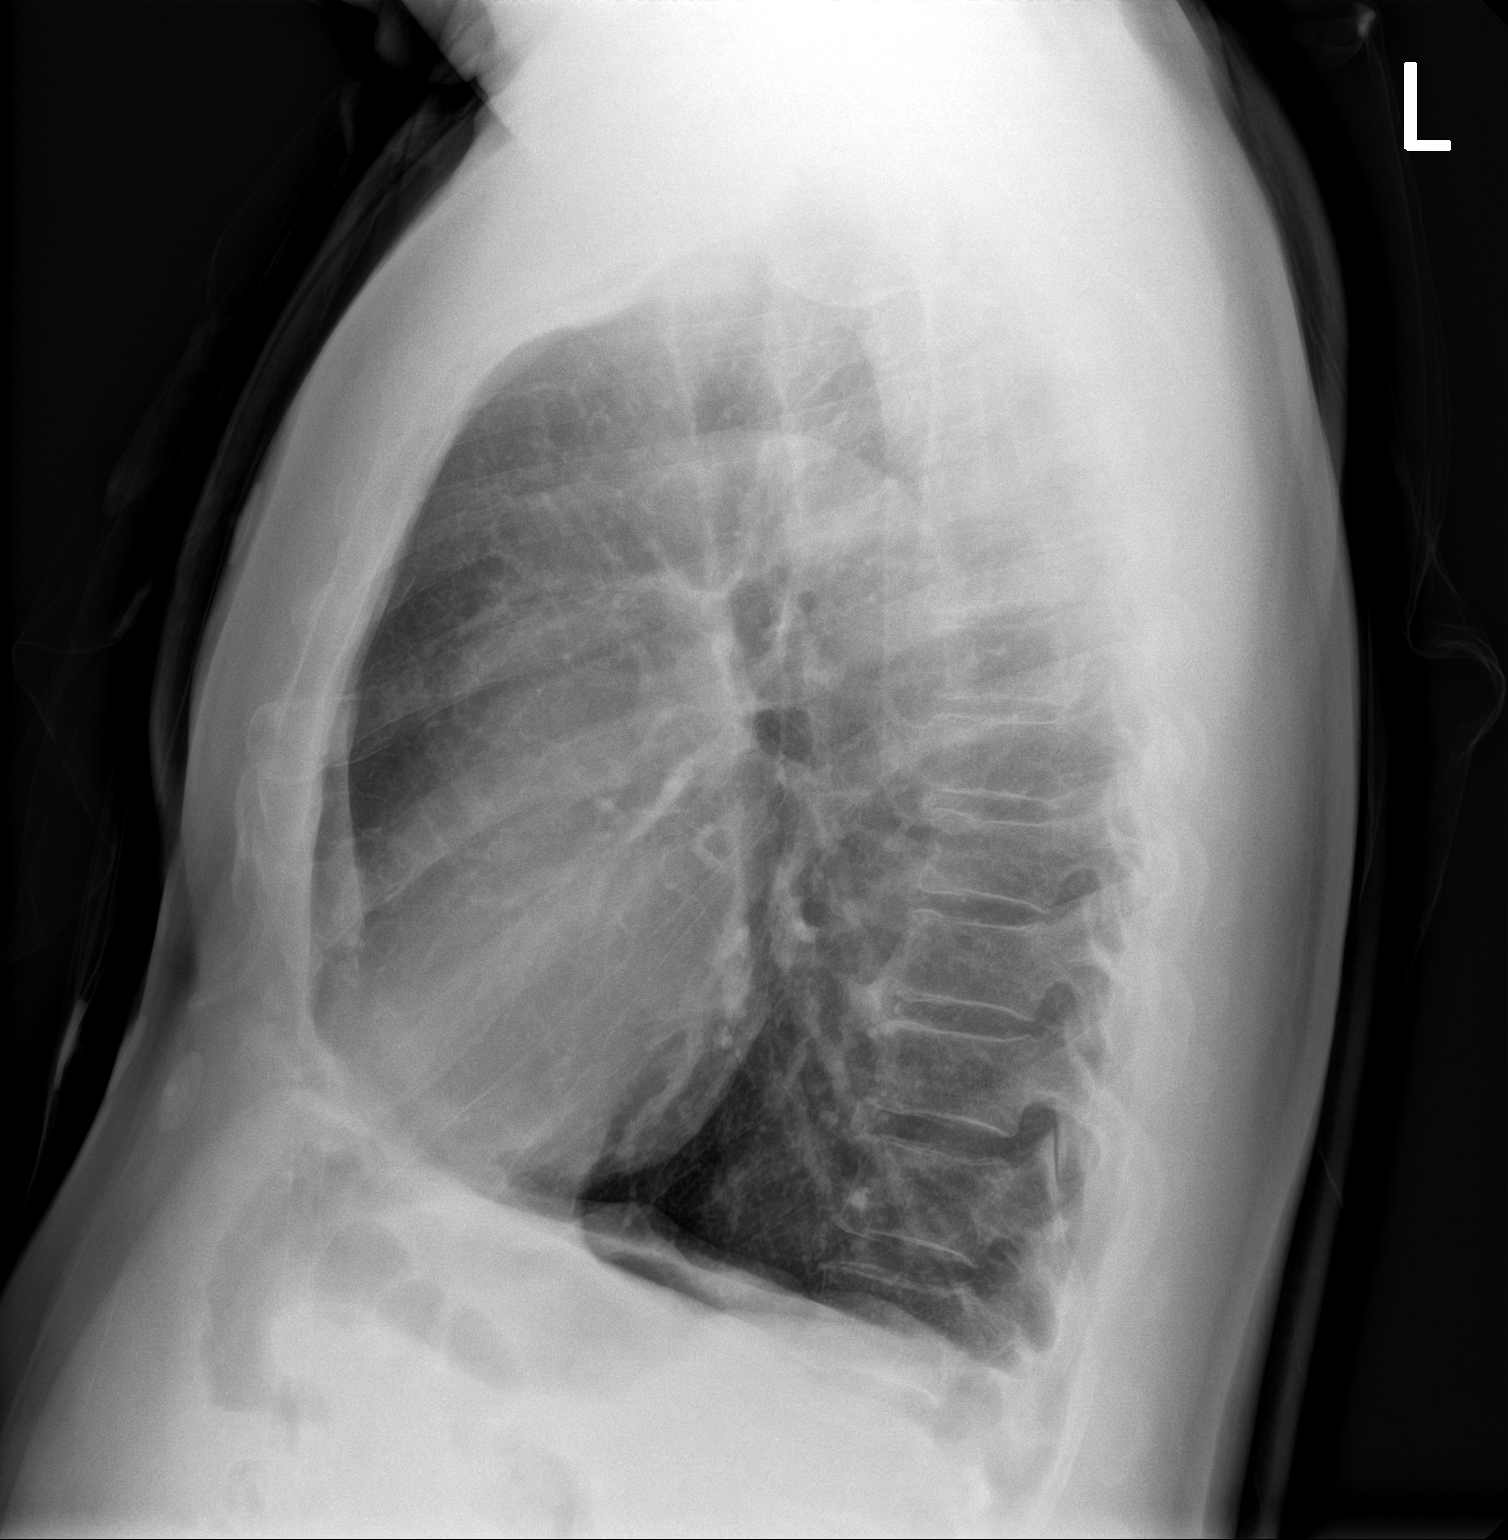

[2 of 2 positions shown; findings below may reference images not displayed]

FINDINGS: The heart size and mediastinal contours are within normal limits.

No focal consolidation. No pulmonary edema. No pleural effusion. No
pneumothorax.

No acute osseous abnormality.
IMPRESSION: No active cardiopulmonary disease.

## 2022-06-24 ENCOUNTER — Other Ambulatory Visit (HOSPITAL_COMMUNITY): Payer: Self-pay

## 2022-07-03 ENCOUNTER — Other Ambulatory Visit (HOSPITAL_COMMUNITY): Payer: Self-pay

## 2022-08-07 ENCOUNTER — Other Ambulatory Visit (HOSPITAL_COMMUNITY): Payer: Self-pay

## 2022-08-07 MED ORDER — LOSARTAN POTASSIUM 50 MG PO TABS
50.0000 mg | ORAL_TABLET | Freq: Every day | ORAL | 0 refills | Status: DC
  Filled 2022-08-07: qty 30, 30d supply, fill #0

## 2022-08-21 IMAGING — DX DG CHEST 2V
2 series · 2 of 2 positions shown · non-contrast
Comparison: Chest x-ray 08/08/2021.

CLINICAL DATA: Chest pain.

EXAM:
CHEST - 2 VIEW

[chest pa]
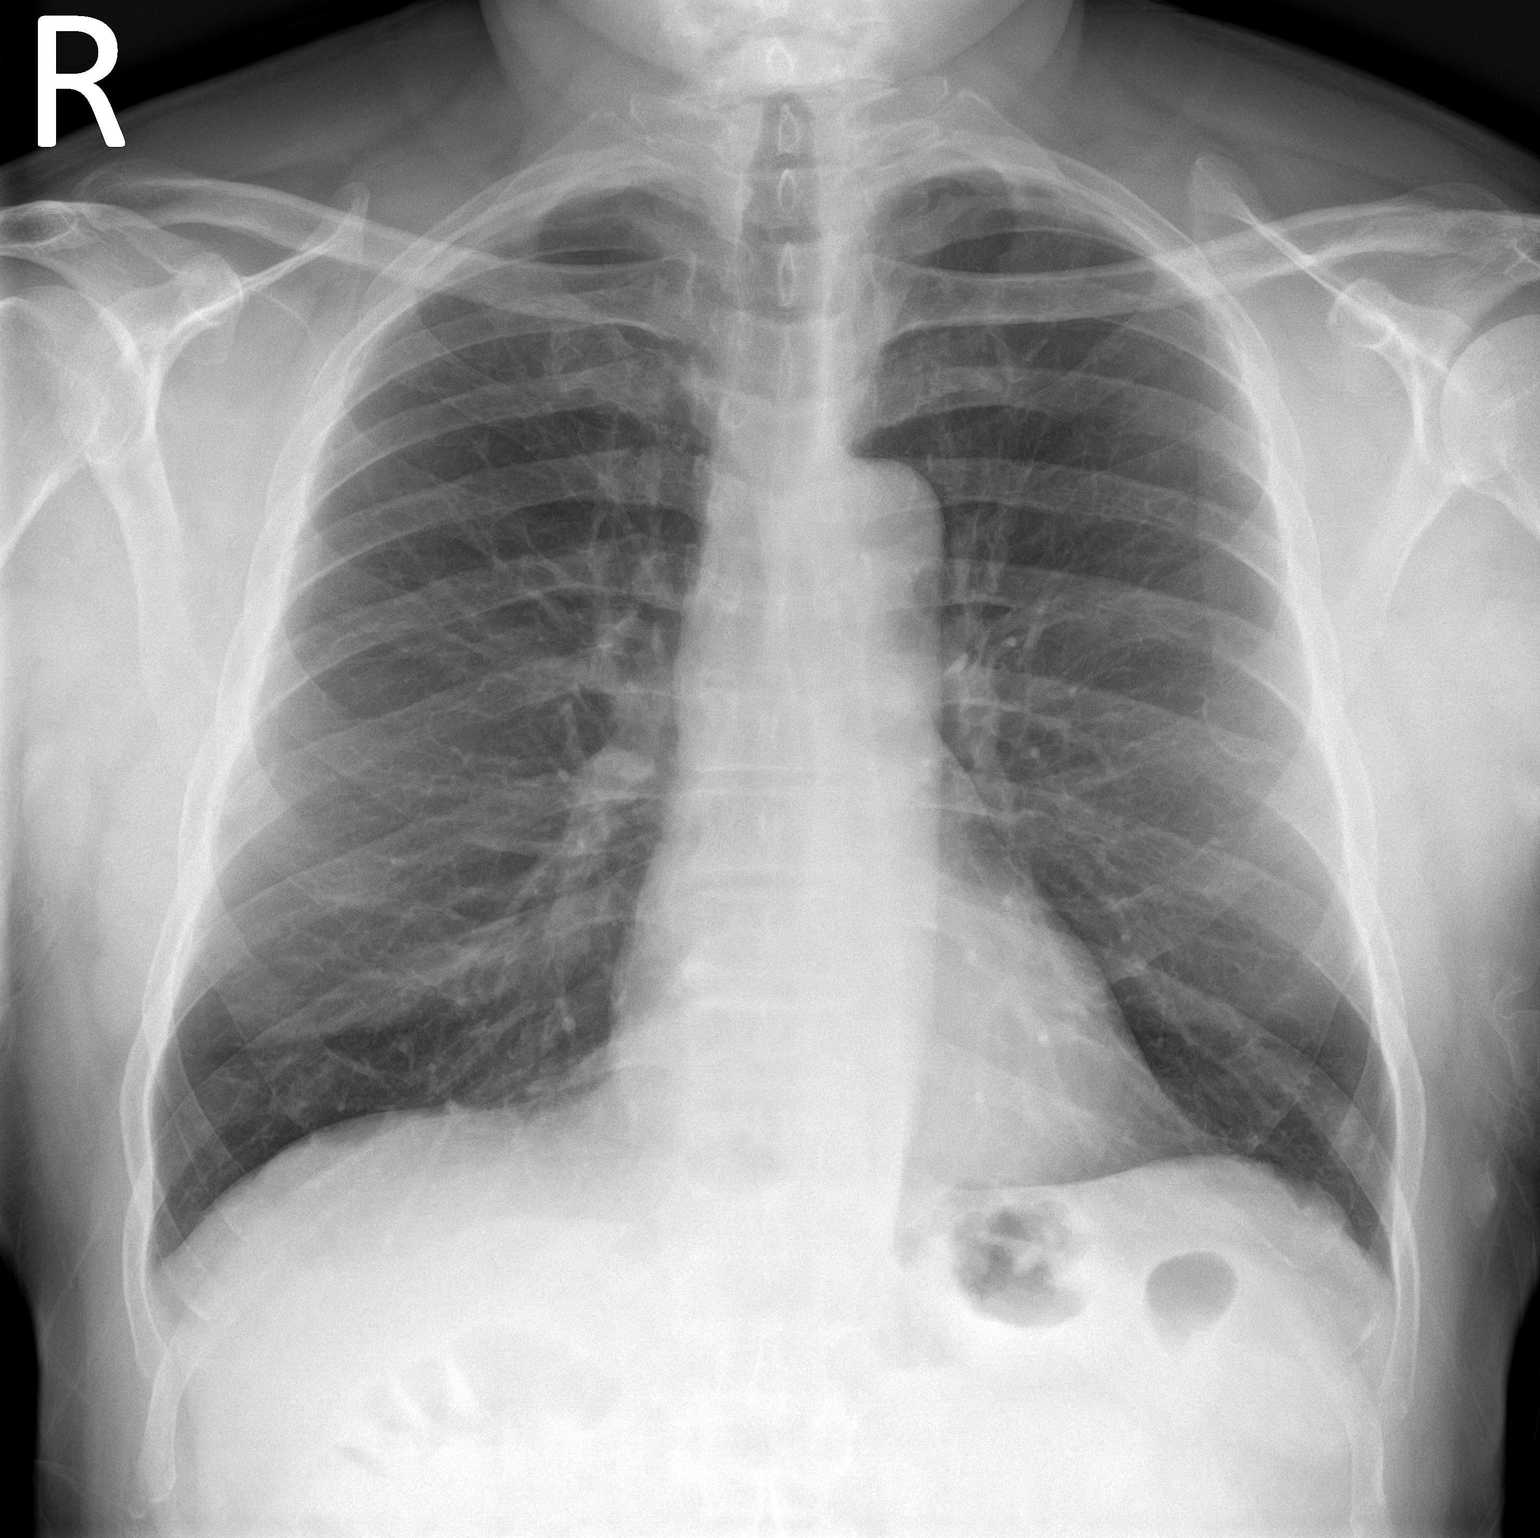

[chest lat]
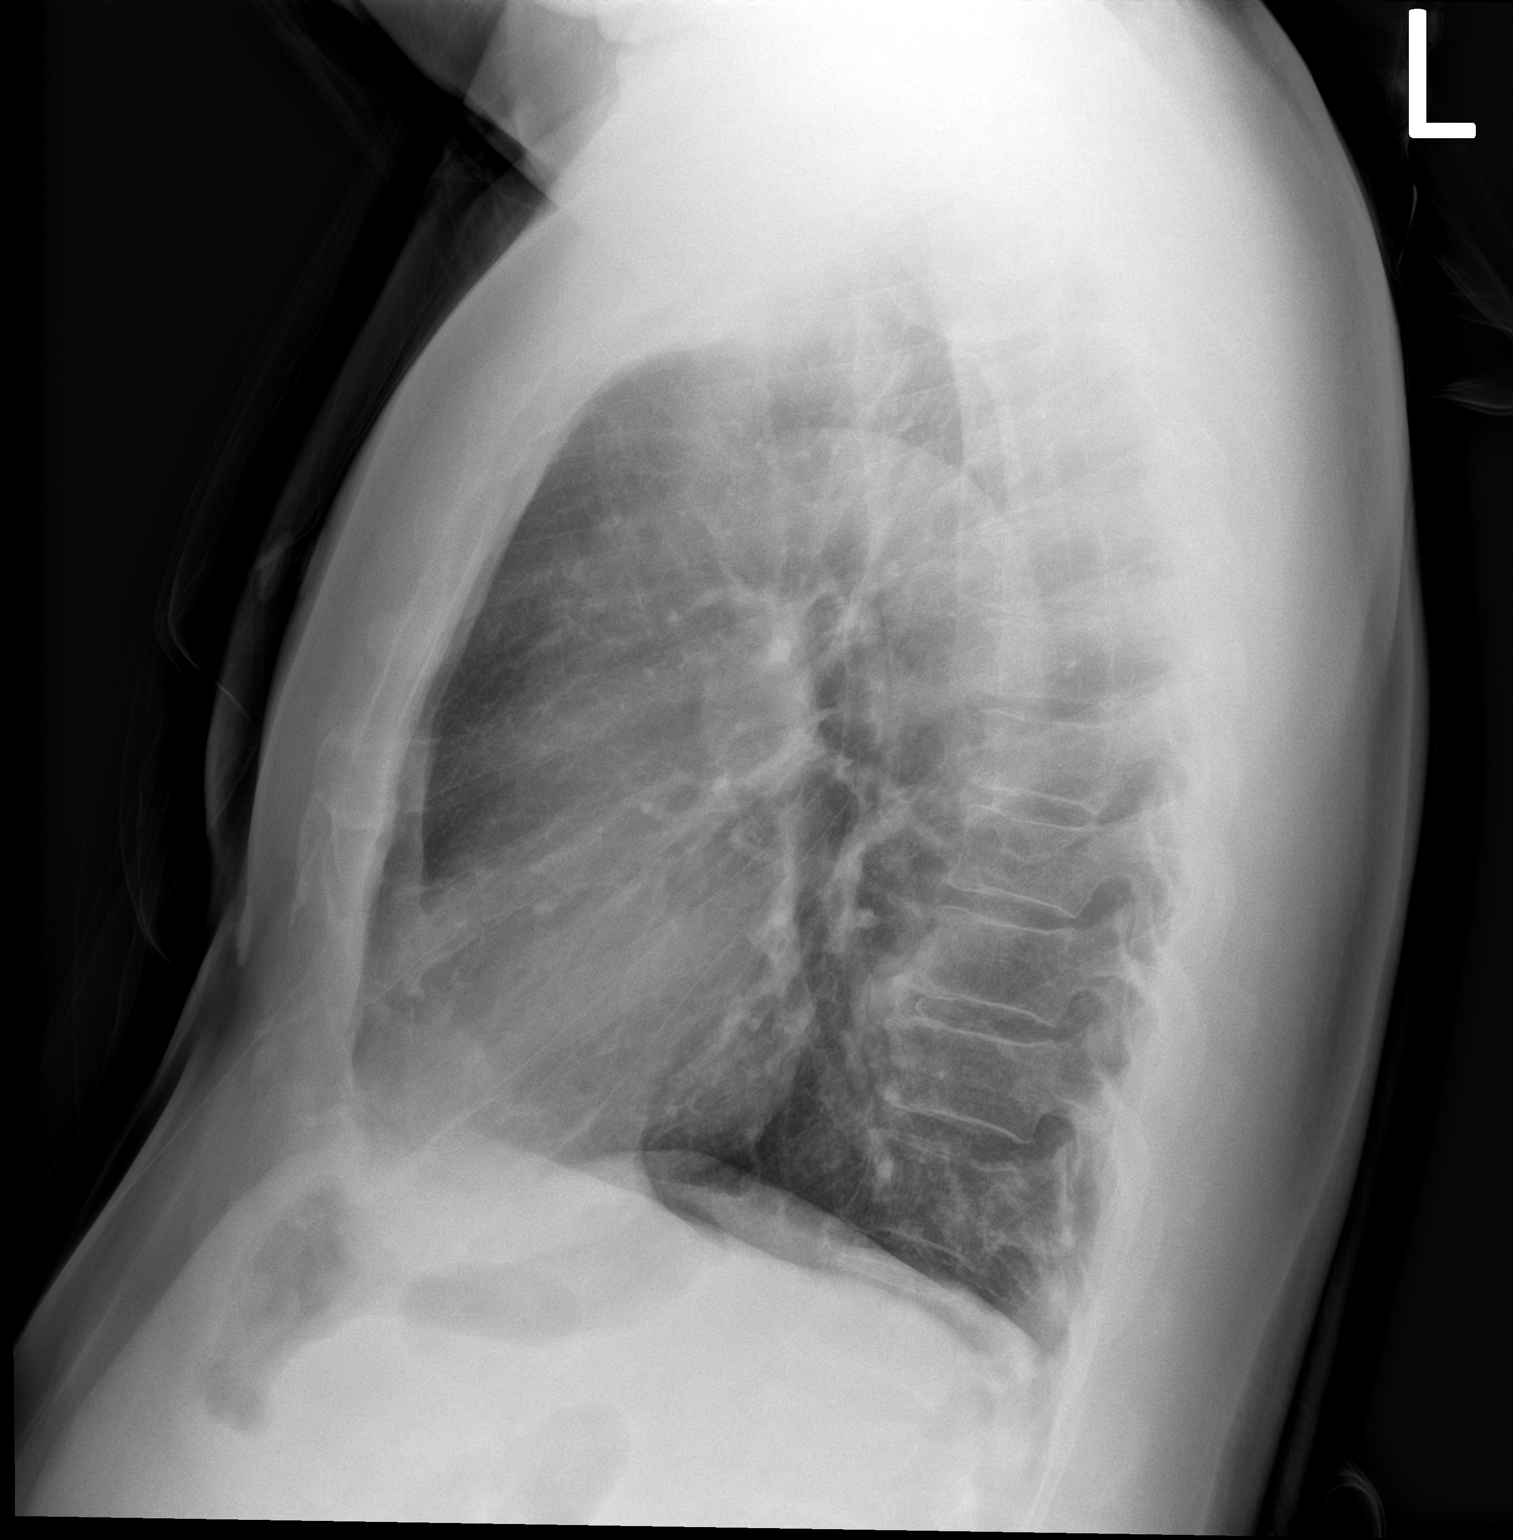

[2 of 2 positions shown; findings below may reference images not displayed]

FINDINGS: The heart size and mediastinal contours are within normal limits.
There is a questionable small nodular density in the right upper
lobe. The lungs are otherwise clear. There is no pleural effusion or
pneumothorax. The visualized skeletal structures are unremarkable.
IMPRESSION: 1. No acute cardiopulmonary process.
2. Questionable small nodular density in the right upper lobe.
Recommend follow-up nonemergent chest CT.

## 2022-09-18 ENCOUNTER — Other Ambulatory Visit (HOSPITAL_COMMUNITY): Payer: Self-pay

## 2022-09-18 DIAGNOSIS — I10 Essential (primary) hypertension: Secondary | ICD-10-CM | POA: Diagnosis not present

## 2022-09-18 DIAGNOSIS — M25422 Effusion, left elbow: Secondary | ICD-10-CM | POA: Diagnosis not present

## 2022-09-18 DIAGNOSIS — E782 Mixed hyperlipidemia: Secondary | ICD-10-CM | POA: Diagnosis not present

## 2022-09-18 MED ORDER — LOSARTAN POTASSIUM 100 MG PO TABS
100.0000 mg | ORAL_TABLET | Freq: Every day | ORAL | 1 refills | Status: DC
  Filled 2022-09-18: qty 90, 90d supply, fill #0
  Filled 2022-12-27: qty 90, 90d supply, fill #1

## 2022-09-18 MED ORDER — ROSUVASTATIN CALCIUM 10 MG PO TABS
10.0000 mg | ORAL_TABLET | Freq: Every day | ORAL | 1 refills | Status: DC
  Filled 2022-09-18: qty 90, 90d supply, fill #0
  Filled 2022-12-19: qty 90, 90d supply, fill #1

## 2022-10-02 ENCOUNTER — Other Ambulatory Visit (HOSPITAL_COMMUNITY): Payer: Self-pay

## 2022-12-19 ENCOUNTER — Other Ambulatory Visit (HOSPITAL_COMMUNITY): Payer: Self-pay

## 2022-12-20 ENCOUNTER — Other Ambulatory Visit (HOSPITAL_COMMUNITY): Payer: Self-pay

## 2022-12-27 ENCOUNTER — Other Ambulatory Visit: Payer: Self-pay

## 2022-12-27 ENCOUNTER — Other Ambulatory Visit (HOSPITAL_COMMUNITY): Payer: Self-pay

## 2023-01-13 ENCOUNTER — Other Ambulatory Visit: Payer: Self-pay

## 2023-01-13 ENCOUNTER — Other Ambulatory Visit (HOSPITAL_COMMUNITY): Payer: Self-pay

## 2023-01-16 ENCOUNTER — Other Ambulatory Visit: Payer: Self-pay

## 2023-01-16 ENCOUNTER — Other Ambulatory Visit (HOSPITAL_COMMUNITY): Payer: Self-pay

## 2023-01-17 ENCOUNTER — Other Ambulatory Visit (HOSPITAL_COMMUNITY): Payer: Self-pay

## 2023-01-17 MED ORDER — HYDROCHLOROTHIAZIDE 25 MG PO TABS
25.0000 mg | ORAL_TABLET | Freq: Every day | ORAL | 0 refills | Status: DC
  Filled 2023-01-17: qty 30, 30d supply, fill #0

## 2023-01-21 ENCOUNTER — Other Ambulatory Visit (HOSPITAL_COMMUNITY): Payer: Self-pay

## 2023-02-21 ENCOUNTER — Other Ambulatory Visit (HOSPITAL_COMMUNITY): Payer: Self-pay

## 2023-02-26 ENCOUNTER — Other Ambulatory Visit (HOSPITAL_COMMUNITY): Payer: Self-pay

## 2023-02-26 MED ORDER — HYDROCHLOROTHIAZIDE 25 MG PO TABS
25.0000 mg | ORAL_TABLET | Freq: Every day | ORAL | 0 refills | Status: DC
  Filled 2023-02-26: qty 30, 30d supply, fill #0

## 2023-04-01 DIAGNOSIS — E782 Mixed hyperlipidemia: Secondary | ICD-10-CM | POA: Diagnosis not present

## 2023-04-01 DIAGNOSIS — Z125 Encounter for screening for malignant neoplasm of prostate: Secondary | ICD-10-CM | POA: Diagnosis not present

## 2023-04-01 DIAGNOSIS — I10 Essential (primary) hypertension: Secondary | ICD-10-CM | POA: Diagnosis not present

## 2023-04-01 DIAGNOSIS — K219 Gastro-esophageal reflux disease without esophagitis: Secondary | ICD-10-CM | POA: Diagnosis not present

## 2023-04-02 ENCOUNTER — Other Ambulatory Visit (HOSPITAL_COMMUNITY): Payer: Self-pay

## 2023-04-02 MED ORDER — HYDROCHLOROTHIAZIDE 25 MG PO TABS
25.0000 mg | ORAL_TABLET | Freq: Every day | ORAL | 0 refills | Status: DC
  Filled 2023-04-02: qty 90, 90d supply, fill #0

## 2023-04-02 MED ORDER — ROSUVASTATIN CALCIUM 10 MG PO TABS
10.0000 mg | ORAL_TABLET | Freq: Every day | ORAL | 1 refills | Status: DC
  Filled 2023-04-02: qty 90, 90d supply, fill #0
  Filled 2023-07-14: qty 90, 90d supply, fill #1

## 2023-04-02 MED ORDER — LOSARTAN POTASSIUM 50 MG PO TABS
50.0000 mg | ORAL_TABLET | Freq: Every day | ORAL | 1 refills | Status: DC
  Filled 2023-04-02: qty 90, 90d supply, fill #0
  Filled 2023-07-14: qty 90, 90d supply, fill #1

## 2023-07-14 ENCOUNTER — Other Ambulatory Visit (HOSPITAL_COMMUNITY): Payer: Self-pay

## 2023-07-14 ENCOUNTER — Other Ambulatory Visit: Payer: Self-pay

## 2023-07-14 MED ORDER — HYDROCHLOROTHIAZIDE 25 MG PO TABS
25.0000 mg | ORAL_TABLET | Freq: Every day | ORAL | 0 refills | Status: DC
  Filled 2023-07-14: qty 90, 90d supply, fill #0

## 2023-08-20 DIAGNOSIS — R7309 Other abnormal glucose: Secondary | ICD-10-CM | POA: Diagnosis not present

## 2023-08-20 DIAGNOSIS — Z0000 Encounter for general adult medical examination without abnormal findings: Secondary | ICD-10-CM | POA: Diagnosis not present

## 2023-09-30 ENCOUNTER — Other Ambulatory Visit (INDEPENDENT_AMBULATORY_CARE_PROVIDER_SITE_OTHER): Payer: Commercial Managed Care - PPO

## 2023-09-30 ENCOUNTER — Ambulatory Visit: Payer: Commercial Managed Care - PPO | Admitting: Orthopaedic Surgery

## 2023-09-30 ENCOUNTER — Encounter: Payer: Self-pay | Admitting: Orthopaedic Surgery

## 2023-09-30 DIAGNOSIS — G8929 Other chronic pain: Secondary | ICD-10-CM

## 2023-09-30 DIAGNOSIS — M25561 Pain in right knee: Secondary | ICD-10-CM | POA: Diagnosis not present

## 2023-09-30 MED ORDER — BUPIVACAINE HCL 0.5 % IJ SOLN
2.0000 mL | INTRAMUSCULAR | Status: AC | PRN
Start: 2023-09-30 — End: 2023-09-30
  Administered 2023-09-30: 2 mL via INTRA_ARTICULAR

## 2023-09-30 MED ORDER — METHYLPREDNISOLONE ACETATE 40 MG/ML IJ SUSP
40.0000 mg | INTRAMUSCULAR | Status: AC | PRN
Start: 2023-09-30 — End: 2023-09-30
  Administered 2023-09-30: 40 mg via INTRA_ARTICULAR

## 2023-09-30 MED ORDER — LIDOCAINE HCL 1 % IJ SOLN
2.0000 mL | INTRAMUSCULAR | Status: AC | PRN
Start: 2023-09-30 — End: 2023-09-30
  Administered 2023-09-30: 2 mL

## 2023-09-30 NOTE — Progress Notes (Signed)
Office Visit Note   Patient: Todd Herrera           Date of Birth: [birth year]           MRN: [id number] Visit Date: 09/30/2023              Requested by: Burnis Medin, PA-C [address] [place],  [place] [address] PCP: Piedad Climes, [place], [place]   Assessment & Plan: Visit Diagnoses:  1. Chronic pain of right knee     Plan: Todd Herrera is a 67 year old gentleman with posttraumatic arthritis of the right knee.  Sounds like his symptoms are getting worse and becoming more frequent.  Treatment options and management discussed with the patient.  He would like to try another cortisone injection today.  Voltaren was recommended.  I did provide him with a packet to read about total knee replacement.    Follow-Up Instructions: No follow-ups on file.   Orders:  Orders Placed This Encounter  Procedures  . XR KNEE 3 VIEW RIGHT   No orders of the defined types were placed in this encounter.     Procedures: Large Joint Inj: R knee on 09/30/2023 7:35 PM Indications: pain Details: 22 G needle  Arthrogram: No  Medications: 40 mg methylPREDNISolone acetate 40 MG/ML; 2 mL lidocaine 1 %; 2 mL bupivacaine 0.5 % Consent was given by the patient. Patient was prepped and draped in the usual sterile fashion.      Clinical Data: No additional findings.   Subjective: Chief Complaint  Patient presents with  . Right Knee - Pain    HPI Todd Herrera is a 67 year old gentleman who comes in for right knee pain for about 2 months.  He feels that he has had more symptoms in the last year and it has become more severe and frequent.  He underwent ORIF of a right lateral tibial plateau fracture in 2012 by Dr. Darrelyn Hillock.  He saw Dr. Darrelyn Hillock last year and got a cortisone shot which did help but the relief has worn off.  He is experiencing morning stiffness and achiness with increased activity.  Review of Systems  Constitutional: Negative.   HENT: Negative.    Eyes: Negative.    Respiratory: Negative.    Cardiovascular: Negative.   Gastrointestinal: Negative.   Endocrine: Negative.   Genitourinary: Negative.   Skin: Negative.   Allergic/Immunologic: Negative.   Neurological: Negative.   Hematological: Negative.   Psychiatric/Behavioral: Negative.    All other systems reviewed and are negative.    Objective: Vital Signs: There were no vitals taken for this visit.  Physical Exam Vitals and nursing note reviewed.  Constitutional:      Appearance: He is well-developed.  HENT:     Head: Normocephalic and atraumatic.  Eyes:     Pupils: Pupils are equal, round, and reactive to light.  Pulmonary:     Effort: Pulmonary effort is normal.  Abdominal:     Palpations: Abdomen is soft.  Musculoskeletal:        General: Normal range of motion.     Cervical back: Neck supple.  Skin:    General: Skin is warm.  Neurological:     Mental Status: He is alert and oriented to person, place, and time.  Psychiatric:        Behavior: Behavior normal.        Thought Content: Thought content normal.        Judgment: Judgment normal.    Ortho Exam Exam of  the right knee shows a fully healed surgical scar.  He has no joint effusion.  Collaterals and cruciates are stable. Specialty Comments:  No specialty comments available.  Imaging: XR KNEE 3 VIEW RIGHT  Result Date: 09/30/2023 X-rays of the right knee shows prior proximal tibia locking plate with associated screws.  Posttraumatic degenerative changes are present.  There is depression of the lateral tibial plateau.    PMFS History: Patient Active Problem List   Diagnosis Date Noted  . Hypertension 10/24/2021   Past Medical History:  Diagnosis Date  . Hypertension     No family history on file.  Past Surgical History:  Procedure Laterality Date  . leg surgery     Social History   Occupational History  . Not on file  Tobacco Use  . Smoking status: Never  . Smokeless tobacco: Never  Vaping Use  .  Vaping status: Never Used  Substance and Sexual Activity  . Alcohol use: Yes  . Drug use: No  . Sexual activity: Not on file

## 2023-10-15 ENCOUNTER — Telehealth: Payer: Self-pay | Admitting: Gastroenterology

## 2023-10-15 NOTE — Telephone Encounter (Signed)
Patient called would like to have a transfer of care from [place] stated he was previously seen by Quentin Mulling PA-C and wants to be within [facility]. Requested records for review. No preference in provider.

## 2023-10-22 NOTE — Telephone Encounter (Signed)
Received only the pathology report from [facility]. Called and left a message to send operative report.

## 2023-10-27 ENCOUNTER — Other Ambulatory Visit (HOSPITAL_BASED_OUTPATIENT_CLINIC_OR_DEPARTMENT_OTHER): Payer: Self-pay

## 2023-10-28 ENCOUNTER — Other Ambulatory Visit (HOSPITAL_BASED_OUTPATIENT_CLINIC_OR_DEPARTMENT_OTHER): Payer: Self-pay

## 2023-10-28 ENCOUNTER — Other Ambulatory Visit (HOSPITAL_COMMUNITY): Payer: Self-pay

## 2023-10-28 MED ORDER — HYDROCHLOROTHIAZIDE 25 MG PO TABS
25.0000 mg | ORAL_TABLET | Freq: Every day | ORAL | 0 refills | Status: DC
  Filled 2023-10-28: qty 30, 30d supply, fill #0

## 2023-10-28 MED ORDER — ROSUVASTATIN CALCIUM 10 MG PO TABS
10.0000 mg | ORAL_TABLET | Freq: Every day | ORAL | 0 refills | Status: DC
  Filled 2023-10-28: qty 30, 30d supply, fill #0

## 2023-10-28 MED ORDER — LOSARTAN POTASSIUM 50 MG PO TABS
50.0000 mg | ORAL_TABLET | Freq: Every day | ORAL | 0 refills | Status: DC
  Filled 2023-10-28: qty 30, 30d supply, fill #0

## 2023-11-03 ENCOUNTER — Other Ambulatory Visit (HOSPITAL_BASED_OUTPATIENT_CLINIC_OR_DEPARTMENT_OTHER): Payer: Self-pay

## 2023-11-18 ENCOUNTER — Other Ambulatory Visit (HOSPITAL_COMMUNITY): Payer: Self-pay

## 2023-11-18 DIAGNOSIS — E782 Mixed hyperlipidemia: Secondary | ICD-10-CM | POA: Diagnosis not present

## 2023-11-18 DIAGNOSIS — M79642 Pain in left hand: Secondary | ICD-10-CM | POA: Diagnosis not present

## 2023-11-18 DIAGNOSIS — M1812 Unilateral primary osteoarthritis of first carpometacarpal joint, left hand: Secondary | ICD-10-CM | POA: Diagnosis not present

## 2023-11-18 DIAGNOSIS — I10 Essential (primary) hypertension: Secondary | ICD-10-CM | POA: Diagnosis not present

## 2023-11-18 MED ORDER — ROSUVASTATIN CALCIUM 10 MG PO TABS
10.0000 mg | ORAL_TABLET | Freq: Every day | ORAL | 1 refills | Status: AC
  Filled 2023-11-18 – 2023-11-21 (×3): qty 90, 90d supply, fill #0
  Filled 2024-03-29: qty 90, 90d supply, fill #1

## 2023-11-18 MED ORDER — HYDROCHLOROTHIAZIDE 25 MG PO TABS
25.0000 mg | ORAL_TABLET | Freq: Every day | ORAL | 1 refills | Status: AC
  Filled 2023-11-18 – 2023-11-21 (×3): qty 90, 90d supply, fill #0
  Filled 2024-03-29: qty 90, 90d supply, fill #1

## 2023-11-18 MED ORDER — LOSARTAN POTASSIUM 50 MG PO TABS
50.0000 mg | ORAL_TABLET | Freq: Every day | ORAL | 1 refills | Status: AC
  Filled 2023-11-18 – 2023-11-21 (×3): qty 90, 90d supply, fill #0
  Filled 2024-03-29: qty 90, 90d supply, fill #1

## 2023-11-18 MED ORDER — MELOXICAM 15 MG PO TABS
15.0000 mg | ORAL_TABLET | Freq: Every day | ORAL | 0 refills | Status: DC
  Filled 2023-11-18 – 2023-11-24 (×2): qty 30, 30d supply, fill #0

## 2023-11-19 ENCOUNTER — Encounter: Payer: Self-pay | Admitting: Pharmacist

## 2023-11-19 ENCOUNTER — Other Ambulatory Visit (HOSPITAL_COMMUNITY): Payer: Self-pay

## 2023-11-19 ENCOUNTER — Other Ambulatory Visit: Payer: Self-pay

## 2023-11-21 ENCOUNTER — Other Ambulatory Visit (HOSPITAL_COMMUNITY): Payer: Self-pay

## 2023-11-21 ENCOUNTER — Other Ambulatory Visit: Payer: Self-pay

## 2023-11-24 ENCOUNTER — Other Ambulatory Visit: Payer: Self-pay

## 2023-11-24 ENCOUNTER — Other Ambulatory Visit (HOSPITAL_COMMUNITY): Payer: Self-pay

## 2024-03-29 ENCOUNTER — Other Ambulatory Visit (HOSPITAL_COMMUNITY): Payer: Self-pay

## 2024-04-05 ENCOUNTER — Emergency Department (HOSPITAL_BASED_OUTPATIENT_CLINIC_OR_DEPARTMENT_OTHER)
Admission: EM | Admit: 2024-04-05 | Discharge: 2024-04-05 | Disposition: A | Discharge status: Home / Self Care | Attending: Emergency Medicine | Admitting: Emergency Medicine

## 2024-04-05 ENCOUNTER — Encounter (HOSPITAL_BASED_OUTPATIENT_CLINIC_OR_DEPARTMENT_OTHER): Payer: Self-pay

## 2024-04-05 ENCOUNTER — Emergency Department (HOSPITAL_BASED_OUTPATIENT_CLINIC_OR_DEPARTMENT_OTHER)

## 2024-04-05 ENCOUNTER — Other Ambulatory Visit (HOSPITAL_BASED_OUTPATIENT_CLINIC_OR_DEPARTMENT_OTHER): Payer: Self-pay

## 2024-04-05 ENCOUNTER — Other Ambulatory Visit: Payer: Self-pay

## 2024-04-05 DIAGNOSIS — R1033 Periumbilical pain: Secondary | ICD-10-CM | POA: Diagnosis present

## 2024-04-05 DIAGNOSIS — K429 Umbilical hernia without obstruction or gangrene: Secondary | ICD-10-CM | POA: Insufficient documentation

## 2024-04-05 DIAGNOSIS — R109 Unspecified abdominal pain: Secondary | ICD-10-CM | POA: Diagnosis not present

## 2024-04-05 LAB — COMPREHENSIVE METABOLIC PANEL WITH GFR
ALT: 13 U/L (ref 0–44)
AST: 19 U/L (ref 15–41)
Albumin: 4.9 g/dL (ref 3.5–5.0)
Alkaline Phosphatase: 63 U/L (ref 38–126)
Anion gap: 15 (ref 5–15)
BUN: 15 mg/dL (ref 8–23)
CO2: 23 mmol/L (ref 22–32)
Calcium: 10 mg/dL (ref 8.9–10.3)
Chloride: 99 mmol/L (ref 98–111)
Creatinine, Ser: 1.07 mg/dL (ref 0.61–1.24)
GFR, Estimated: >60 mL/min (ref >60)
Glucose, Bld: 135 mg/dL — ABNORMAL HIGH (ref 70–99)
Potassium: 3.6 mmol/L (ref 3.5–5.1)
Sodium: 137 mmol/L (ref 135–145)
Total Bilirubin: 0.9 mg/dL (ref 0.0–1.2)
Total Protein: 8.7 g/dL — ABNORMAL HIGH (ref 6.5–8.1)

## 2024-04-05 LAB — CBC
HCT: 42.9 % (ref 39.0–52.0)
Hemoglobin: 15.3 g/dL (ref 13.0–17.0)
MCH: 32.8 pg (ref 26.0–34.0)
MCHC: 35.7 g/dL (ref 30.0–36.0)
MCV: 92.1 fL (ref 80.0–100.0)
Platelets: 306 10*3/uL (ref 150–400)
RBC: 4.66 MIL/uL (ref 4.22–5.81)
RDW: 12.7 % (ref 11.5–15.5)
WBC: 11.3 10*3/uL — ABNORMAL HIGH (ref 4.0–10.5)
nRBC: 0 % (ref 0.0–0.2)

## 2024-04-05 LAB — LIPASE, BLOOD: Lipase: 39 U/L (ref 11–51)

## 2024-04-05 MED ORDER — ONDANSETRON HCL 4 MG/2ML IJ SOLN
4.0000 mg | Freq: Once | INTRAMUSCULAR | Status: AC
  Administered 2024-04-05: 4 mg via INTRAVENOUS
  Filled 2024-04-05: qty 2

## 2024-04-05 MED ORDER — IOHEXOL 300 MG/ML  SOLN
100.0000 mL | Freq: Once | INTRAMUSCULAR | Status: AC | PRN
  Administered 2024-04-05: 100 mL via INTRAVENOUS

## 2024-04-05 MED ORDER — MORPHINE SULFATE (PF) 4 MG/ML IV SOLN
4.0000 mg | Freq: Once | INTRAVENOUS | Status: AC
  Administered 2024-04-05: 4 mg via INTRAVENOUS
  Filled 2024-04-05: qty 1

## 2024-04-05 MED ORDER — TRAMADOL HCL 50 MG PO TABS
50.0000 mg | ORAL_TABLET | Freq: Four times a day (QID) | ORAL | 0 refills | Status: DC | PRN
  Filled 2024-04-05: qty 15, 4d supply, fill #0

## 2024-04-05 NOTE — ED Notes (Signed)
 ED Provider at bedside.

## 2024-04-05 NOTE — ED Triage Notes (Signed)
 Pt c/o "chronic, chronic stomach pains long, lingering problem x22yrs." Intermittent associated nausea, poor PO x3.5 days, no BM in same. Denies urinary symptoms

## 2024-04-05 NOTE — Discharge Instructions (Signed)
 Contact a health care provider if: You get new pain, swelling, or redness near your hernia. You have trouble pooping. Your poop is harder or larger than normal. You have a fever or chills. You have nausea or vomiting. Your hernia can't be pushed in. Get help right away if: You have belly pain that gets worse. Your hernia: Changes in shape or size. Changes color. Feels hard or hurts when you touch it. These symptoms may be an emergency. Call 911 right away.

## 2024-04-05 NOTE — ED Provider Notes (Signed)
 [place] EMERGENCY DEPARTMENT AT [facility] Provider Note   CSN: [id number] Arrival date & time: 04/05/24  1317     History  Chief Complaint  Patient presents with   Abdominal Pain    Todd Herrera is a 68 y.o. male who presents to the ED with a cc of abdominal pain. He states that his pain began "during the pandemic." Since that time he has had more frequent and progressively worsening pain around his umbilicus.  Patient reports that he used to happen maybe once a month where he would get pain in his bellybutton and it would go away after an hour or 2 but about a month ago it lasted for 3 days and then he has had pain around his umbilicus now for the past 3-1/2 days which she describes as excruciating throbbing and constant.  He has associated nausea without vomiting.  He states he cannot eat because "it makes the pain worse."  He denies any inability to pass gas or make a bowel movement.  Patient does report that at the onset of his pain he had "a really hard, painful lump in my umbilical hernia."    Abdominal Pain      Home Medications Prior to Admission medications   Medication Sig Start Date End Date Taking? Authorizing Provider  Blood Pressure Monitoring (OMRON 3 SERIES BP MONITOR) DEVI Use as directed to check blood pressure 08/09/21   [place], [place], RPH  COVID-19 mRNA bivalent vaccine, Pfizer, (PFIZER COVID-19 VAC BIVALENT) injection Inject into the muscle. 11/09/21   Liane Redman, MD  hydrochlorothiazide (HYDRODIURIL) 25 MG tablet Take 1 tablet (25 mg total) by mouth daily. 07/14/23     hydrochlorothiazide (HYDRODIURIL) 25 MG tablet Take 1 tablet (25 mg total) by mouth daily. 11/18/23     influenza vac split quadrivalent PF (FLUARIX QUADRIVALENT) 0.5 ML injection Inject into the muscle. 11/09/21     losartan (COZAAR) 50 MG tablet Take 1 tablet (50 mg total) by mouth daily. 04/01/23     losartan (COZAAR) 50 MG tablet Take 1 tablet (50 mg total) by mouth daily.  11/18/23     meloxicam (MOBIC) 15 MG tablet Take 1 tablet (15 mg total) by mouth daily. 11/18/23     rosuvastatin (CRESTOR) 10 MG tablet Take 1 tablet (10 mg total) by mouth daily. 06/05/22     rosuvastatin (CRESTOR) 10 MG tablet Take 1 tablet (10 mg total) by mouth daily. 11/18/23         Allergies    Patient has no known allergies.    Review of Systems   Review of Systems  Gastrointestinal:  Positive for abdominal pain.    Physical Exam Updated Vital Signs BP (!) 151/94   Pulse (!) 109   Temp 98.4 F (36.9 C)   Resp 16   SpO2 95%  Physical Exam Vitals and nursing note reviewed.  Constitutional:      General: He is not in acute distress.    Appearance: He is well-developed. He is not diaphoretic.  HENT:     Head: Normocephalic and atraumatic.  Eyes:     General: No scleral icterus.    Conjunctiva/sclera: Conjunctivae normal.  Cardiovascular:     Rate and Rhythm: Normal rate and regular rhythm.     Heart sounds: Normal heart sounds.  Pulmonary:     Effort: Pulmonary effort is normal. No respiratory distress.     Breath sounds: Normal breath sounds.  Abdominal:     General: There is  no distension.     Palpations: Abdomen is soft.     Tenderness: There is no abdominal tenderness.     Hernia: A hernia is present. Hernia is present in the umbilical area.     Comments: Tender Bilik all hernia without palpable evidence of incarceration or strangulation.  Easily reduces.  Musculoskeletal:     Cervical back: Normal range of motion and neck supple.  Skin:    General: Skin is warm and dry.  Neurological:     Mental Status: He is alert.  Psychiatric:        Behavior: Behavior normal.     ED Results / Procedures / Treatments   Labs (all labs ordered are listed, but only abnormal results are displayed) Labs Reviewed  COMPREHENSIVE METABOLIC PANEL WITH GFR - Abnormal; Notable for the following components:      Result Value   Glucose, Bld 135 (*)    Total Protein 8.7 (*)     All other components within normal limits  CBC - Abnormal; Notable for the following components:   WBC 11.3 (*)    All other components within normal limits  LIPASE, BLOOD  URINALYSIS, ROUTINE W REFLEX MICROSCOPIC    EKG None  Radiology No results found.  Procedures Procedures    Medications Ordered in ED Medications - No data to display  ED Course/ Medical Decision Making/ A&P                                 Medical Decision Making Amount and/or Complexity of Data Reviewed Labs: ordered. Radiology: ordered.  Risk Prescription drug management.   This patient presents to the ED with chief complaint(s) of abdominal pain with pertinent past medical history of a local hernia which further complicates the presenting complaint. The complaint involves an extensive differential diagnosis and treatment options and also carries with it a high risk of complications and morbidity.    The differential diagnosis for generalized abdominal pain includes, but is not limited to AAA, gastroenteritis, appendicitis, Bowel obstruction, Bowel perforation. Gastroparesis, DKA, Hernia, Inflammatory bowel disease, mesenteric ischemia, pancreatitis, peritonitis SBP, volvulus.   The initial plan is to order the abdomen pelvis, pain medication and ice for the umbilicus   Additional history obtained: Additional history obtained from  EMR Reassessment and review (also see workup area): Lab Tests: I Ordered, and personally interpreted labs.  The pertinent results include:   Labs reviewed, mild white count elevation likely due to acute phase reaction in setting of pain, glucose 135, remainder of labs unremarkable  Imaging Studies: I ordered and independently visualized and interpreted the following imaging CT scan abdomen and pelvis   which showed umbilical hernia fat-containing with some inflammatory stranding no obvious signs of incarceration or strangulation of bowel The interpretation of the  imaging was limited to assessing for emergent pathology, for which purpose it was ordered. Medicines ordered and prescription drug management: I ordered the following medications morphine ice pack for local hernia pain I considered this additional medications: Tramadol at discharge   Reevaluation of the patient after these medicines showed that the patient    improved      Complexity of problems addressed: Patient's presentation is most consistent with  acute illness/injury with systemic symptoms During patient's assessment  Disposition: After consideration of the diagnostic results and the patient's response to treatment,  I feel that the patent would benefit from discharge with close outpatient follow-up with  surgical consultation .patient here with umbilical hernia pain worsening the past several months.  Patient appears to have a fat-containing hernia which is causing him pain no evidence of incarceration or strangulation.  Advised stool softeners, counterpressure, ice and pain medications with very close surgical follow-up and strict return precautions for worsening in condition including severe intractable pain, discoloration or change in the hernia causing pain and concern.         Final Clinical Impression(s) / ED Diagnoses Final diagnoses:  None    Rx / DC Orders ED Discharge Orders     None         Tama Fails, PA-C 04/05/24 2014    Hershel Los, MD 04/05/24 [phone]

## 2024-04-06 ENCOUNTER — Other Ambulatory Visit (HOSPITAL_BASED_OUTPATIENT_CLINIC_OR_DEPARTMENT_OTHER): Payer: Self-pay

## 2024-04-15 ENCOUNTER — Ambulatory Visit: Payer: Self-pay | Admitting: General Surgery

## 2024-04-15 DIAGNOSIS — K429 Umbilical hernia without obstruction or gangrene: Secondary | ICD-10-CM | POA: Diagnosis not present

## 2024-05-10 NOTE — Patient Instructions (Signed)
 SURGICAL WAITING ROOM VISITATION  Patients having surgery or a procedure may have no more than 2 support people in the waiting area - these visitors may rotate.    Children under the age of 40 must have an adult with them who is not the patient.  Due to an increase in RSV and influenza rates and associated hospitalizations, children ages 1 and under may not visit patients in [facility] hospitals.  Visitors with respiratory illnesses are discouraged from visiting and should remain at home.  If the patient needs to stay at the hospital during part of their recovery, the visitor guidelines for inpatient rooms apply. Pre-op nurse will coordinate an appropriate time for 1 support person to accompany patient in pre-op.  This support person may not rotate.    Please refer to the [facility] website for the visitor guidelines for Inpatients (after your surgery is over and you are in a regular room).       Your procedure is scheduled on: 05/24/24   Report to [facility] Main Entrance    Report to admitting at : 9:15 AM   Call this number if you have problems the morning of surgery [phone]   Eat a light diet the day before surgery.  Examples including soups, broths, toast, yogurt, mashed potatoes.  Things to avoid include carbonated beverages (fizzy beverages), raw fruits and raw vegetables, or beans.   If your bowels are filled with gas, your surgeon will have difficulty visualizing your pelvic organs which increases your surgical risks.  Do not eat food :After Midnight.   After Midnight you may have the following liquids until : 8:30 AM DAY OF SURGERY  Water Non-Citrus Juices (without pulp, NO RED-Apple, White grape, White cranberry) Black Coffee (NO MILK/CREAM OR CREAMERS, sugar ok)  Clear Tea (NO MILK/CREAM OR CREAMERS, sugar ok) regular and decaf                             Plain Jell-O (NO RED)                                           Fruit ices (not with fruit pulp,  NO RED)                                     Popsicles (NO RED)                                                               Sports drinks like Gatorade (NO RED)              FOLLOW ANY ADDITIONAL PRE OP INSTRUCTIONS YOU RECEIVED FROM YOUR SURGEON'S OFFICE!!!   Oral Hygiene is also important to reduce your risk of infection.                                    Remember - BRUSH YOUR TEETH THE MORNING OF SURGERY WITH YOUR REGULAR TOOTHPASTE  DENTURES WILL BE REMOVED  PRIOR TO SURGERY PLEASE DO NOT APPLY "Poly grip" OR ADHESIVES!!!   Do NOT smoke after Midnight   Stop all vitamins and herbal supplements 7 days before surgery.   Take these medicines the morning of surgery with A SIP OF WATER: NONE                              You may not have any metal on your body including hair pins, jewelry, and body piercing             Do not wear lotions, powders, perfumes/cologne, or deodorant              Men may shave face and neck.   Do not bring valuables to the hospital. [facility] IS NOT             RESPONSIBLE   FOR VALUABLES.   Contacts, glasses, dentures or bridgework may not be worn into surgery.   Bring small overnight bag day of surgery.   DO NOT BRING YOUR HOME MEDICATIONS TO THE HOSPITAL. PHARMACY WILL DISPENSE MEDICATIONS LISTED ON YOUR MEDICATION LIST TO YOU DURING YOUR ADMISSION IN THE HOSPITAL!    Patients discharged on the day of surgery will not be allowed to drive home.  Someone NEEDS to stay with you for the first 24 hours after anesthesia.   Special Instructions: Bring a copy of your healthcare power of attorney and living will documents the day of surgery if you haven't scanned them before.              Please read over the following fact sheets you were given: IF YOU HAVE QUESTIONS ABOUT YOUR PRE-OP INSTRUCTIONS PLEASE CALL [phone]   If you received a COVID test during your pre-op visit  it is requested that you wear a mask when out in public, stay away from  anyone that may not be feeling well and notify your surgeon if you develop symptoms. If you test positive for Covid or have been in contact with anyone that has tested positive in the last 10 days please notify you surgeon.    [place] - Preparing for Surgery Before surgery, you can play an important role.  Because skin is not sterile, your skin needs to be as free of germs as possible.  You can reduce the number of germs on your skin by washing with CHG (chlorahexidine gluconate) soap before surgery.  CHG is an antiseptic cleaner which kills germs and bonds with the skin to continue killing germs even after washing. Please DO NOT use if you have an allergy to CHG or antibacterial soaps.  If your skin becomes reddened/irritated stop using the CHG and inform your nurse when you arrive at Short Stay. Do not shave (including legs and underarms) for at least 48 hours prior to the first CHG shower.  You may shave your face/neck. Please follow these instructions carefully:  1.  Shower with CHG Soap the night before surgery and the  morning of Surgery.  2.  If you choose to wash your hair, wash your hair first as usual with your  normal  shampoo.  3.  After you shampoo, rinse your hair and body thoroughly to remove the  shampoo.                           4.  Use CHG as you would any other  liquid soap.  You can apply chg directly  to the skin and wash                       Gently with a scrungie or clean washcloth.  5.  Apply the CHG Soap to your body ONLY FROM THE NECK DOWN.   Do not use on face/ open                           Wound or open sores. Avoid contact with eyes, ears mouth and genitals (private parts).                       Wash face,  Genitals (private parts) with your normal soap.             6.  Wash thoroughly, paying special attention to the area where your surgery  will be performed.  7.  Thoroughly rinse your body with warm water from the neck down.  8.  DO NOT shower/wash with your  normal soap after using and rinsing off  the CHG Soap.                9.  Pat yourself dry with a clean towel.            10.  Wear clean pajamas.            11.  Place clean sheets on your bed the night of your first shower and do not  sleep with pets. Day of Surgery : Do not apply any lotions/deodorants the morning of surgery.  Please wear clean clothes to the hospital/surgery center.  FAILURE TO FOLLOW THESE INSTRUCTIONS MAY RESULT IN THE CANCELLATION OF YOUR SURGERY PATIENT SIGNATURE_________________________________  NURSE SIGNATURE__________________________________  ________________________________________________________________________

## 2024-05-12 ENCOUNTER — Encounter (HOSPITAL_COMMUNITY): Payer: Self-pay

## 2024-05-12 ENCOUNTER — Encounter (HOSPITAL_COMMUNITY)
Admission: RE | Admit: 2024-05-12 | Discharge: 2024-05-12 | Disposition: A | Discharge status: Home / Self Care | Source: Ambulatory Visit | Attending: General Surgery | Admitting: General Surgery

## 2024-05-12 ENCOUNTER — Other Ambulatory Visit: Payer: Self-pay

## 2024-05-12 VITALS — BP 158/82 | HR 85 | Temp 98.2°F | Ht 70.5 in | Wt 187.0 lb

## 2024-05-12 DIAGNOSIS — Z01818 Encounter for other preprocedural examination: Secondary | ICD-10-CM | POA: Diagnosis not present

## 2024-05-12 DIAGNOSIS — R9431 Abnormal electrocardiogram [ECG] [EKG]: Secondary | ICD-10-CM | POA: Diagnosis not present

## 2024-05-12 DIAGNOSIS — I10 Essential (primary) hypertension: Secondary | ICD-10-CM | POA: Insufficient documentation

## 2024-05-12 HISTORY — DX: Prediabetes: R73.03

## 2024-05-12 HISTORY — DX: Unspecified osteoarthritis, unspecified site: M19.90

## 2024-05-12 LAB — BASIC METABOLIC PANEL WITH GFR
Anion gap: 7 (ref 5–15)
BUN: 10 mg/dL (ref 8–23)
CO2: 26 mmol/L (ref 22–32)
Calcium: 9.3 mg/dL (ref 8.9–10.3)
Chloride: 104 mmol/L (ref 98–111)
Creatinine, Ser: 1 mg/dL (ref 0.61–1.24)
GFR, Estimated: >60 mL/min (ref >60)
Glucose, Bld: 110 mg/dL — ABNORMAL HIGH (ref 70–99)
Potassium: 3.5 mmol/L (ref 3.5–5.1)
Sodium: 137 mmol/L (ref 135–145)

## 2024-05-12 LAB — CBC
HCT: 39.1 % (ref 39.0–52.0)
Hemoglobin: 13.6 g/dL (ref 13.0–17.0)
MCH: 33.2 pg (ref 26.0–34.0)
MCHC: 34.8 g/dL (ref 30.0–36.0)
MCV: 95.4 fL (ref 80.0–100.0)
Platelets: 246 10*3/uL (ref 150–400)
RBC: 4.1 MIL/uL — ABNORMAL LOW (ref 4.22–5.81)
RDW: 12.6 % (ref 11.5–15.5)
WBC: 7.9 10*3/uL (ref 4.0–10.5)
nRBC: 0 % (ref 0.0–0.2)

## 2024-05-12 NOTE — Progress Notes (Addendum)
 For Anesthesia: PCP - Chancey Combe, [place]  E, PA-C  Cardiologist - N/A  Bowel Prep reminder:  Chest x-ray -  EKG - 05/12/24 Stress Test -  ECHO - 10/12/21 Cardiac Cath -  Pacemaker/ICD device last checked: Pacemaker orders received: Device Rep notified:  Spinal Cord Stimulator: N/A  Sleep Study -  N/A CPAP -   Fasting Blood Sugar -  N/A Checks Blood Sugar _____ times a day Date and result of last Hgb A1c-  Last dose of GLP1 agonist-  N/A GLP1 instructions:   Last dose of SGLT-2 inhibitors-  N/A SGLT-2 instructions:   Blood Thinner Instructions: N/A Aspirin Instructions: Last Dose:  Activity level: Can go up a flight of stairs and activities of daily living without stopping and without chest pain and/or shortness of breath   Able to exercise without chest pain and/or shortness of breath  Anesthesia review: Hx: HTN  Patient denies shortness of breath, fever, cough and chest pain at PAT appointment   Patient verbalized understanding of instructions that were given to them at the PAT appointment. Patient was also instructed that they will need to review over the PAT instructions again at home before surgery.

## 2024-05-24 ENCOUNTER — Other Ambulatory Visit (HOSPITAL_BASED_OUTPATIENT_CLINIC_OR_DEPARTMENT_OTHER): Payer: Self-pay

## 2024-05-24 ENCOUNTER — Other Ambulatory Visit (HOSPITAL_COMMUNITY): Payer: Self-pay

## 2024-05-24 ENCOUNTER — Other Ambulatory Visit: Payer: Self-pay

## 2024-05-24 ENCOUNTER — Ambulatory Visit (HOSPITAL_COMMUNITY)
Admission: RE | Admit: 2024-05-24 | Discharge: 2024-05-24 | Disposition: A | Discharge status: Home / Self Care | Attending: General Surgery | Admitting: General Surgery

## 2024-05-24 ENCOUNTER — Ambulatory Visit (HOSPITAL_COMMUNITY): Admitting: Registered Nurse

## 2024-05-24 ENCOUNTER — Encounter (HOSPITAL_COMMUNITY)
Admission: RE | Disposition: A | Discharge status: Home / Self Care | Payer: Self-pay | Source: Home / Self Care | Attending: General Surgery

## 2024-05-24 ENCOUNTER — Encounter (HOSPITAL_COMMUNITY): Payer: Self-pay | Admitting: General Surgery

## 2024-05-24 DIAGNOSIS — K420 Umbilical hernia with obstruction, without gangrene: Secondary | ICD-10-CM

## 2024-05-24 DIAGNOSIS — I10 Essential (primary) hypertension: Secondary | ICD-10-CM | POA: Insufficient documentation

## 2024-05-24 DIAGNOSIS — M1990 Unspecified osteoarthritis, unspecified site: Secondary | ICD-10-CM | POA: Insufficient documentation

## 2024-05-24 DIAGNOSIS — K439 Ventral hernia without obstruction or gangrene: Secondary | ICD-10-CM | POA: Diagnosis not present

## 2024-05-24 DIAGNOSIS — Z79899 Other long term (current) drug therapy: Secondary | ICD-10-CM | POA: Insufficient documentation

## 2024-05-24 SURGERY — REPAIR, HERNIA, UMBILICAL, ROBOT-ASSISTED
Anesthesia: General

## 2024-05-24 MED ORDER — ONDANSETRON HCL 4 MG/2ML IJ SOLN
INTRAMUSCULAR | Status: AC
  Filled 2024-05-24: qty 2

## 2024-05-24 MED ORDER — ACETAMINOPHEN 325 MG PO TABS
650.0000 mg | ORAL_TABLET | Freq: Four times a day (QID) | ORAL | 0 refills | Status: AC
  Filled 2024-05-24: qty 50, 7d supply, fill #0

## 2024-05-24 MED ORDER — CEFAZOLIN SODIUM-DEXTROSE 2-4 GM/100ML-% IV SOLN
2.0000 g | INTRAVENOUS | Status: AC
  Administered 2024-05-24: 2 g via INTRAVENOUS
  Filled 2024-05-24: qty 100

## 2024-05-24 MED ORDER — OXYCODONE HCL 5 MG PO TABS
5.0000 mg | ORAL_TABLET | Freq: Three times a day (TID) | ORAL | 0 refills | Status: AC | PRN
  Filled 2024-05-24 (×2): qty 12, 4d supply, fill #0

## 2024-05-24 MED ORDER — ACETAMINOPHEN 10 MG/ML IV SOLN: 1000.0000 mg | Freq: Once | INTRAVENOUS | Status: DC | PRN

## 2024-05-24 MED ORDER — ROCURONIUM BROMIDE 10 MG/ML (PF) SYRINGE
PREFILLED_SYRINGE | INTRAVENOUS | Status: DC | PRN
Start: 2024-05-24 — End: 2024-05-24
  Administered 2024-05-24: 70 mg via INTRAVENOUS
  Administered 2024-05-24: 30 mg via INTRAVENOUS

## 2024-05-24 MED ORDER — PROPOFOL 10 MG/ML IV BOLUS
INTRAVENOUS | Status: AC
  Filled 2024-05-24: qty 20

## 2024-05-24 MED ORDER — MIDAZOLAM HCL 2 MG/2ML IJ SOLN
INTRAMUSCULAR | Status: AC
  Filled 2024-05-24: qty 2

## 2024-05-24 MED ORDER — OXYCODONE HCL 5 MG PO TABS: 5.0000 mg | ORAL_TABLET | Freq: Once | ORAL | Status: DC | PRN

## 2024-05-24 MED ORDER — MIDAZOLAM HCL 5 MG/5ML IJ SOLN
INTRAMUSCULAR | Status: DC | PRN
Start: 2024-05-24 — End: 2024-05-24
  Administered 2024-05-24: 2 mg via INTRAVENOUS

## 2024-05-24 MED ORDER — CELECOXIB 200 MG PO CAPS
200.0000 mg | ORAL_CAPSULE | ORAL | Status: AC
  Administered 2024-05-24: 200 mg via ORAL
  Filled 2024-05-24: qty 1

## 2024-05-24 MED ORDER — FENTANYL CITRATE (PF) 100 MCG/2ML IJ SOLN
INTRAMUSCULAR | Status: DC | PRN
  Administered 2024-05-24: 100 ug via INTRAVENOUS
  Administered 2024-05-24 (×2): 50 ug via INTRAVENOUS

## 2024-05-24 MED ORDER — DEXAMETHASONE SODIUM PHOSPHATE 10 MG/ML IJ SOLN
INTRAMUSCULAR | Status: DC | PRN
  Administered 2024-05-24: 8 mg via INTRAVENOUS

## 2024-05-24 MED ORDER — BUPIVACAINE LIPOSOME 1.3 % IJ SUSP
INTRAMUSCULAR | Status: DC | PRN
  Administered 2024-05-24: 20 mL

## 2024-05-24 MED ORDER — CHLORHEXIDINE GLUCONATE CLOTH 2 % EX PADS: 6.0000 | MEDICATED_PAD | Freq: Once | CUTANEOUS | Status: DC

## 2024-05-24 MED ORDER — DEXAMETHASONE SODIUM PHOSPHATE 10 MG/ML IJ SOLN
INTRAMUSCULAR | Status: AC
  Filled 2024-05-24: qty 1

## 2024-05-24 MED ORDER — ACETAMINOPHEN 500 MG PO TABS
1000.0000 mg | ORAL_TABLET | ORAL | Status: AC
  Administered 2024-05-24: 1000 mg via ORAL
  Filled 2024-05-24: qty 2

## 2024-05-24 MED ORDER — BUPIVACAINE LIPOSOME 1.3 % IJ SUSP
INTRAMUSCULAR | Status: AC
  Filled 2024-05-24: qty 20

## 2024-05-24 MED ORDER — OXYCODONE HCL 5 MG/5ML PO SOLN: 5.0000 mg | Freq: Once | ORAL | Status: DC | PRN

## 2024-05-24 MED ORDER — ORAL CARE MOUTH RINSE: 15.0000 mL | Freq: Once | OROMUCOSAL | Status: AC

## 2024-05-24 MED ORDER — ROCURONIUM BROMIDE 10 MG/ML (PF) SYRINGE
PREFILLED_SYRINGE | INTRAVENOUS | Status: AC
  Filled 2024-05-24: qty 10

## 2024-05-24 MED ORDER — PHENYLEPHRINE HCL-NACL 20-0.9 MG/250ML-% IV SOLN
INTRAVENOUS | Status: DC | PRN
Start: 2024-05-24 — End: 2024-05-24
  Administered 2024-05-24: 50 ug/min via INTRAVENOUS

## 2024-05-24 MED ORDER — BUPIVACAINE-EPINEPHRINE 0.25% -1:200000 IJ SOLN
INTRAMUSCULAR | Status: DC | PRN
  Administered 2024-05-24: 30 mL

## 2024-05-24 MED ORDER — FENTANYL CITRATE (PF) 100 MCG/2ML IJ SOLN
INTRAMUSCULAR | Status: AC
  Filled 2024-05-24: qty 2

## 2024-05-24 MED ORDER — LACTATED RINGERS IV SOLN: INTRAVENOUS | Status: DC

## 2024-05-24 MED ORDER — CHLORHEXIDINE GLUCONATE 0.12 % MT SOLN
15.0000 mL | Freq: Once | OROMUCOSAL | Status: AC
  Administered 2024-05-24: 15 mL via OROMUCOSAL

## 2024-05-24 MED ORDER — GABAPENTIN 300 MG PO CAPS
300.0000 mg | ORAL_CAPSULE | ORAL | Status: AC
  Administered 2024-05-24: 300 mg via ORAL
  Filled 2024-05-24: qty 1

## 2024-05-24 MED ORDER — 0.9 % SODIUM CHLORIDE (POUR BTL) OPTIME
TOPICAL | Status: DC | PRN
  Administered 2024-05-24: 1000 mL

## 2024-05-24 MED ORDER — BUPIVACAINE-EPINEPHRINE (PF) 0.25% -1:200000 IJ SOLN
INTRAMUSCULAR | Status: AC
  Filled 2024-05-24: qty 30

## 2024-05-24 MED ORDER — LIDOCAINE 2% (20 MG/ML) 5 ML SYRINGE
INTRAMUSCULAR | Status: DC | PRN
  Administered 2024-05-24: 1.5 mg/kg/h via INTRAVENOUS
  Administered 2024-05-24: 70 mg via INTRAVENOUS

## 2024-05-24 MED ORDER — SUGAMMADEX SODIUM 200 MG/2ML IV SOLN
INTRAVENOUS | Status: DC | PRN
  Administered 2024-05-24: 200 mg via INTRAVENOUS

## 2024-05-24 MED ORDER — IBUPROFEN 200 MG PO TABS
600.0000 mg | ORAL_TABLET | Freq: Four times a day (QID) | ORAL | 0 refills | Status: AC
Start: 2024-05-24 — End: 2024-05-30
  Filled 2024-05-24: qty 75, 7d supply, fill #0

## 2024-05-24 MED ORDER — PROPOFOL 10 MG/ML IV BOLUS
INTRAVENOUS | Status: DC | PRN
  Administered 2024-05-24: 140 mg via INTRAVENOUS
  Administered 2024-05-24: 20 mg via INTRAVENOUS

## 2024-05-24 MED ORDER — FENTANYL CITRATE (PF) 100 MCG/2ML IJ SOLN
INTRAMUSCULAR | Status: AC
Start: 2024-05-24 — End: ?
  Filled 2024-05-24: qty 2

## 2024-05-24 MED ORDER — FENTANYL CITRATE PF 50 MCG/ML IJ SOSY: 25.0000 ug | PREFILLED_SYRINGE | INTRAMUSCULAR | Status: DC | PRN

## 2024-05-24 MED ORDER — ONDANSETRON HCL 4 MG/2ML IJ SOLN
INTRAMUSCULAR | Status: DC | PRN
  Administered 2024-05-24: 4 mg via INTRAVENOUS

## 2024-05-24 SURGICAL SUPPLY — 45 items
APPLICATOR COTTON TIP 6 STRL (MISCELLANEOUS) IMPLANT
APPLICATOR COTTON TIP 6IN STRL (MISCELLANEOUS) IMPLANT
BAG COUNTER SPONGE SURGICOUNT (BAG) IMPLANT
BLADE SURG SZ11 CARB STEEL (BLADE) ×2 IMPLANT
CHLORAPREP W/TINT 26 (MISCELLANEOUS) ×2 IMPLANT
COVER SURGICAL LIGHT HANDLE (MISCELLANEOUS) ×2 IMPLANT
COVER TIP SHEARS 8 DVNC (MISCELLANEOUS) ×2 IMPLANT
DERMABOND ADVANCED .7 DNX12 (GAUZE/BANDAGES/DRESSINGS) ×2 IMPLANT
DRAPE ARM DVNC X/XI (DISPOSABLE) ×8 IMPLANT
DRAPE COLUMN DVNC XI (DISPOSABLE) ×2 IMPLANT
DRIVER NDL MEGA SUTCUT DVNCXI (INSTRUMENTS) ×2 IMPLANT
DRIVER NDLE MEGA SUTCUT DVNCXI (INSTRUMENTS) ×1 IMPLANT
ELECT PENCIL ROCKER SW 15FT (MISCELLANEOUS) ×2 IMPLANT
ELECT REM PT RETURN 15FT ADLT (MISCELLANEOUS) ×2 IMPLANT
GAUZE 4X4 16PLY ~~LOC~~+RFID DBL (SPONGE) ×2 IMPLANT
GLOVE BIO SURGEON STRL SZ7.5 (GLOVE) ×4 IMPLANT
GOWN STRL REUS W/ TWL XL LVL3 (GOWN DISPOSABLE) ×4 IMPLANT
IRRIGATION SUCT STRKRFLW 2 WTP (MISCELLANEOUS) IMPLANT
KIT BASIN OR (CUSTOM PROCEDURE TRAY) ×2 IMPLANT
KIT TURNOVER KIT A (KITS) IMPLANT
MARKER SKIN DUAL TIP RULER LAB (MISCELLANEOUS) ×2 IMPLANT
MESH OVITEX LPR PERM 9X9 4L (Mesh General) IMPLANT
NDL HYPO 22X1.5 SAFETY MO (MISCELLANEOUS) ×2 IMPLANT
NDL INSUFFLATION 14GA 120MM (NEEDLE) ×2 IMPLANT
NEEDLE HYPO 22X1.5 SAFETY MO (MISCELLANEOUS) ×1 IMPLANT
NEEDLE INSUFFLATION 14GA 120MM (NEEDLE) ×1 IMPLANT
OBTURATOR OPTICALSTD 8 DVNC (TROCAR) ×2 IMPLANT
PACK CARDIOVASCULAR III (CUSTOM PROCEDURE TRAY) ×2 IMPLANT
SCISSORS MNPLR CVD DVNC XI (INSTRUMENTS) ×2 IMPLANT
SEAL UNIV 5-12 XI (MISCELLANEOUS) ×8 IMPLANT
SOLUTION ANTFG W/FOAM PAD STRL (MISCELLANEOUS) ×2 IMPLANT
SOLUTION ELECTROSURG ANTI STCK (MISCELLANEOUS) ×2 IMPLANT
SPIKE FLUID TRANSFER (MISCELLANEOUS) ×2 IMPLANT
SUT MNCRL AB 4-0 PS2 18 (SUTURE) ×2 IMPLANT
SUT STRATA PDS 2-0 23 CT-1 (SUTURE) IMPLANT
SUT STRATAFIX SPIRAL PDS3-0 (SUTURE) IMPLANT
SUTURE STRAFIX SYMMETRC 1-0 12 (SUTURE) IMPLANT
SYR 10ML LL (SYRINGE) ×2 IMPLANT
SYR 20ML LL LF (SYRINGE) ×2 IMPLANT
SYSTEM RETRIEVL 5MM INZII UNIV (BASKET) IMPLANT
TOWEL GREEN STERILE FF (TOWEL DISPOSABLE) ×2 IMPLANT
TOWEL OR 17X26 10 PK STRL BLUE (TOWEL DISPOSABLE) ×2 IMPLANT
TROCAR Z-THREAD FIOS 5X100MM (TROCAR) IMPLANT
TROCAR Z-THREAD OPTICAL 5X100M (TROCAR) IMPLANT
TUBING INSUFFLATION 10FT LAP (TUBING) ×2 IMPLANT

## 2024-05-24 NOTE — Anesthesia Procedure Notes (Addendum)
 Procedure Name: Intubation Date/Time: 05/24/2024 11:02 AM  Performed by: Marshall Skeeter, CRNAPre-anesthesia Checklist: Patient identified, Suction available, Patient being monitored, Timeout performed and Emergency Drugs available Patient Re-evaluated:Patient Re-evaluated prior to induction Oxygen Delivery Method: Circle system utilized Preoxygenation: Pre-oxygenation with 100% oxygen Induction Type: IV induction Ventilation: Oral airway inserted - appropriate to patient size Laryngoscope Size: Annabell Key and 2 Grade View: Grade II Tube type: Oral Tube size: 7.5 mm Number of attempts: 1 Airway Equipment and Method: Stylet and Oral airway Placement Confirmation: ETT inserted through vocal cords under direct vision, positive ETCO2, CO2 detector and breath sounds checked- equal and bilateral Secured at: 23 cm Tube secured with: Tape Comments: Intubation by [place] Lax, CRNA

## 2024-05-24 NOTE — Op Note (Signed)
 [facility] ([id number])  Operative Report  Date 05/24/2024  PREOPERATIVE DIAGNOSIS: Ventral hernia (2cm x 2cm)  POSTOPERATIVE DIAGNOSIS: Same  SURGEON: Armond Bertin, MD  ANESTHESIA: General   PROCEDURE:  @OSUORPROCLAT @  PROCEDURE: 1. Ventral Hernia Repair with Intra-Peritoneal Onlay Mesh (Defect 2cm x 2cm)  INTRAOPERATIVE FINDINGS: Umbilical hernia with a fascial defect of 2 cm (craniocaudal) x 2 cm (transverse)  ESTIMATED BLOOD LOSS: Minimal  HERNIA CHARACTERISTICS: Location: umbilical Approach: Robotic Initial/Recurrent: No Reducible/Incarcerated: incarcerated Mesh Implantation: Ovitex LPR 9cm x 9cm round Hernia Size: 2cm x 2cm  IMPLANTS: Implant Name Type Inv. Item Serial No. Manufacturer Lot No. LRB No. Used Action  MESH OVITEX LPR PERM 9X9 [id number] - [id number] Mesh General MESH OVITEX LPR PERM 9X9 [id number]  [employer] [id number] N/A 1 Implanted    COMPLICATIONS: None  SPECIMENS: None  OPERATIVE INDICATIONS: Pt is a 68 y.o. male who was evaluated in the office for a symptomatic umbilical hernia.  The risks, benefits and alternatives of the procedure were explained to the patient and the patient agreed to proceed and signed informed consent in front of a witness.   DESCRIPTION OF PROCEDURE: After preoperative identifying the patient in holding, the patient was brought to the operating room and placed supine on the operating table. SCDs were placed.  After induction of general anesthesia, the patient was given the appropriate perioperative antibiotics.  Both arms were tucked and padded to avoid potential nerve injury.  The abdomen was prepped and draped in a typical sterile fashion.  A JACHO approved time out, where the name of the patient, the operation, and intended site was confirmed.  The procedure was begun.  An incision was made in the left upper quadrant and the Veress needle was introduced into the abdomen under standard drop technique.  The abdomen was insufflated  with air.  An 8 mm left upper quadrant optical trocar was placed under direct visualization and the abdomen was inspected.  No evidence of injury was evident at the trocar site or secondary to the Veress needle.  Two additional 8 mm ports were placed along the left abdomen.  The daVinci robot was brought into the field and docked.  A 30 degree scope was placed in the middle port. Graspers were placed in the left flank port and dissection scissors were placed in the epigastric port.  The hernia was inspected.  The hernia was noted to contain a large amount of fat without evidence of bowel involvement.  The hernia was reduced with a combination of blunt dissection and selective use of electrocautery. The hernia sac was quite large. I excised it and removed it from the abdomen using an endocatch bag.The space adjacent to the hernia was developed to accommodate adequate space to place our mesh.  Attention was turned to the hernia.  The defect measured 2cm x 2cm.  After defining our defect, the defect was closed primarily with a running 0 Stratafix Suture.  Next, a 9cm round piece of Ovitex LPR mesh was introduced into the abdomen.  The mesh was aligned appropriately with the defect to provide adequate coverage in all directions.  There was good coverage of the defect at completion.  The mesh was secured to the abdominal wall using running 2-0 Stratafix suture.  The abdomen was thoroughly inspected a final time and hemostasis was assured.  Any suture needles were removed from the body.  The robot was undocked from the patient.  The remaining ports were removed and the abdomen was  desulfated.  The port sites were closed, local anesthetic was infiltrated, and Dermabond applied.  After confirming twice that the sponge, needle, and instrument counts were correct, the procedure was terminated, the patient was extubated and transferred to the recovery room in stable condition.  DISPOSITION: Stable to PACU.   Electronically  Signed By: Cannon Champion 05/24/2024 1:19 PM

## 2024-05-24 NOTE — Discharge Instructions (Addendum)

## 2024-05-24 NOTE — Anesthesia Postprocedure Evaluation (Signed)
 Anesthesia Post Note  Patient: Todd Herrera  Procedure(s) Performed: REPAIR, HERNIA, UMBILICAL, ROBOT-ASSISTED     Patient location during evaluation: PACU Anesthesia Type: General Level of consciousness: awake and alert Pain management: pain level controlled Vital Signs Assessment: post-procedure vital signs reviewed and stable Respiratory status: spontaneous breathing, nonlabored ventilation and respiratory function stable Cardiovascular status: blood pressure returned to baseline and stable Postop Assessment: no apparent nausea or vomiting Anesthetic complications: no   No notable events documented.               Cashis Rill

## 2024-05-24 NOTE — Anesthesia Preprocedure Evaluation (Addendum)
 Anesthesia Evaluation  Patient identified by MRN, date of birth, ID band Patient awake    Reviewed: Allergy & Precautions, NPO status , Patient's Chart, lab work & pertinent test results  History of Anesthesia Complications Negative for: history of anesthetic complications  Airway Mallampati: III  TM Distance: >3 FB Neck ROM: Full    Dental  (+) Dental Advisory Given   Pulmonary neg pulmonary ROS   breath sounds clear to auscultation       Cardiovascular hypertension, Pt. on medications  Rhythm:Regular     Neuro/Psych negative neurological ROS  negative psych ROS   GI/Hepatic Neg liver ROS,,,  Endo/Other  negative endocrine ROS    Renal/GU negative Renal ROSLab Results      Component                Value               Date                      NA                       137                 05/12/2024                K                        3.5                 05/12/2024                CO2                      26                  05/12/2024                GLUCOSE                  110 (H)             05/12/2024                BUN                      10                  05/12/2024                CREATININE               1.00                05/12/2024                CALCIUM                   9.3                 05/12/2024                GFRNONAA                 >60                 05/12/2024  Musculoskeletal  (+) Arthritis ,    Abdominal   Peds  Hematology Lab Results      Component                Value               Date                      WBC                      7.9                 05/12/2024                HGB                      13.6                05/12/2024                HCT                      39.1                05/12/2024                MCV                      95.4                05/12/2024                PLT                      246                 05/12/2024              Anesthesia  Other Findings   Reproductive/Obstetrics                             Anesthesia Physical Anesthesia Plan  ASA: 2  Anesthesia Plan: General   Post-op Pain Management: Tylenol  PO (pre-op)*, Gabapentin PO (pre-op)* and Celebrex PO (pre-op)*   Induction: Intravenous  PONV Risk Score and Plan: 3 and Ondansetron , Dexamethasone  and Midazolam  Airway Management Planned: Oral ETT  Additional Equipment: None  Intra-op Plan:   Post-operative Plan: Extubation in OR  Informed Consent: I have reviewed the patients History and Physical, chart, labs and discussed the procedure including the risks, benefits and alternatives for the proposed anesthesia with the patient or authorized representative who has indicated his/her understanding and acceptance.     Dental advisory given  Plan Discussed with: CRNA  Anesthesia Plan Comments:         Anesthesia Quick Evaluation

## 2024-05-24 NOTE — Transfer of Care (Signed)
 Immediate Anesthesia Transfer of Care Note  Patient: Todd Herrera  Procedure(s) Performed: REPAIR, HERNIA, UMBILICAL, ROBOT-ASSISTED  Patient Location: PACU  Anesthesia Type:General  Level of Consciousness: awake, alert , oriented, and patient cooperative  Airway & Oxygen Therapy: Patient Spontanous Breathing and Patient connected to face mask oxygen  Post-op Assessment: Report given to RN, Post -op Vital signs reviewed and stable, and Patient moving all extremities  Post vital signs: Reviewed and stable  Last Vitals:  Vitals Value Taken Time  BP 158/92 05/24/24 1313  Temp    Pulse 69 05/24/24 1314  Resp 14 05/24/24 1314  SpO2 100 % 05/24/24 1314  Vitals shown include unfiled device data.  Last Pain:  Vitals:   05/24/24 0953  TempSrc: Oral         Complications: No notable events documented.

## 2024-05-24 NOTE — H&P (Signed)
     Todd Herrera [birth year]  [id number].    HPI:  68 y/o M with a ventral hernia who presents for elective repair. He reports that he is in his usual state of health and denies any recent changes in medication.   ROS: Review of Systems  Constitutional: Negative.   HENT: Negative.    Eyes: Negative.   Respiratory: Negative.    Cardiovascular: Negative.   Gastrointestinal: Negative.   Genitourinary: Negative.   Musculoskeletal: Negative.   Skin: Negative.   Neurological: Negative.   Endo/Heme/Allergies: Negative.   Psychiatric/Behavioral: Negative.      History reviewed. No pertinent family history.  Past Medical History:  Diagnosis Date   Arthritis    Hypertension    Pre-diabetes     Past Surgical History:  Procedure Laterality Date   leg surgery Right    TONSILLECTOMY      Social History:  reports that he has never smoked. He has never used smokeless tobacco. He reports current alcohol use. He reports that he does not use drugs.  Allergies: No Known Allergies  Medications Prior to Admission  Medication Sig Dispense Refill   hydrochlorothiazide  (HYDRODIURIL ) 25 MG tablet Take 1 tablet (25 mg total) by mouth daily. 90 tablet 1   losartan  (COZAAR ) 50 MG tablet Take 1 tablet (50 mg total) by mouth daily. 90 tablet 1   rosuvastatin  (CRESTOR ) 10 MG tablet Take 1 tablet (10 mg total) by mouth daily. 90 tablet 1   Blood Pressure Monitoring (OMRON 3 SERIES BP MONITOR) DEVI Use as directed to check blood pressure 1 each 0   hydrochlorothiazide  (HYDRODIURIL ) 25 MG tablet Take 1 tablet (25 mg total) by mouth daily. (Patient not taking: Reported on 05/06/2024) 90 tablet 0   losartan  (COZAAR ) 50 MG tablet Take 1 tablet (50 mg total) by mouth daily. (Patient not taking: Reported on 05/06/2024) 90 tablet 1   rosuvastatin  (CRESTOR ) 10 MG tablet Take 1 tablet (10 mg total) by mouth daily. (Patient not taking: Reported on 05/06/2024) 90 tablet 0   traMADol  (ULTRAM ) 50 MG tablet Take  1 tablet (50 mg total) by mouth every 6 (six) hours as needed. (Patient not taking: Reported on 05/06/2024) 15 tablet 0    Physical Exam: Blood pressure (!) 153/97, pulse 91, temperature 98.1 F (36.7 C), temperature source Oral, resp. rate 18, height 5' 10.5" (1.791 m), weight 84.8 kg, SpO2 99%. Gen: male, NAD Abd: soft, non-distended, bulge marked  No results found for this or any previous visit (from the past 48 hours). No results found.  Assessment/Plan 68 y/o M w/ a ventral hernia who presents for elective repair  - Will proceed to the OR. We discussed the alternatives and potential risks of surgery, including but not limited to: bleeding, infection, damage to bowel or surrounding structures, mesh complications, chronic pain, recurrent hernia, and need for additional procedures. All questions were addressed and consent was obtained.    [facility] 05/24/2024, 10:21 AM Please see Amion for pager number during day hours 7:00am-4:30pm or 7:00am -11:30am on weekends

## 2024-05-26 ENCOUNTER — Other Ambulatory Visit (HOSPITAL_COMMUNITY): Payer: Self-pay

## 2024-07-27 ENCOUNTER — Other Ambulatory Visit: Payer: Self-pay

## 2024-07-27 ENCOUNTER — Other Ambulatory Visit (HOSPITAL_COMMUNITY): Payer: Self-pay

## 2024-07-27 DIAGNOSIS — Z8719 Personal history of other diseases of the digestive system: Secondary | ICD-10-CM | POA: Diagnosis not present

## 2024-07-27 DIAGNOSIS — I10 Essential (primary) hypertension: Secondary | ICD-10-CM | POA: Diagnosis not present

## 2024-07-27 DIAGNOSIS — R7309 Other abnormal glucose: Secondary | ICD-10-CM | POA: Diagnosis not present

## 2024-07-27 DIAGNOSIS — E782 Mixed hyperlipidemia: Secondary | ICD-10-CM | POA: Diagnosis not present

## 2024-07-27 DIAGNOSIS — Z9889 Other specified postprocedural states: Secondary | ICD-10-CM | POA: Diagnosis not present

## 2024-07-27 MED ORDER — HYDROCHLOROTHIAZIDE 25 MG PO TABS
25.0000 mg | ORAL_TABLET | Freq: Every day | ORAL | 1 refills | Status: AC
  Filled 2024-07-27: qty 90, 90d supply, fill #0
  Filled 2024-10-27: qty 90, 90d supply, fill #1

## 2024-07-27 MED ORDER — ROSUVASTATIN CALCIUM 10 MG PO TABS
10.0000 mg | ORAL_TABLET | Freq: Every day | ORAL | 1 refills | Status: AC
  Filled 2024-07-27: qty 90, 90d supply, fill #0
  Filled 2024-10-27: qty 90, 90d supply, fill #1

## 2024-07-27 MED ORDER — LOSARTAN POTASSIUM 50 MG PO TABS
50.0000 mg | ORAL_TABLET | Freq: Every day | ORAL | 1 refills | Status: AC
  Filled 2024-07-27: qty 90, 90d supply, fill #0
  Filled 2024-10-27: qty 90, 90d supply, fill #1

## 2024-08-24 DIAGNOSIS — I8311 Varicose veins of right lower extremity with inflammation: Secondary | ICD-10-CM | POA: Diagnosis not present

## 2024-08-24 DIAGNOSIS — R1031 Right lower quadrant pain: Secondary | ICD-10-CM | POA: Diagnosis not present

## 2024-08-24 DIAGNOSIS — I10 Essential (primary) hypertension: Secondary | ICD-10-CM | POA: Diagnosis not present

## 2024-08-24 DIAGNOSIS — R1032 Left lower quadrant pain: Secondary | ICD-10-CM | POA: Diagnosis not present

## 2024-08-24 DIAGNOSIS — Z0000 Encounter for general adult medical examination without abnormal findings: Secondary | ICD-10-CM | POA: Diagnosis not present

## 2024-09-29 ENCOUNTER — Other Ambulatory Visit: Payer: Self-pay

## 2024-10-06 DIAGNOSIS — M79661 Pain in right lower leg: Secondary | ICD-10-CM | POA: Diagnosis not present

## 2024-10-06 DIAGNOSIS — I8312 Varicose veins of left lower extremity with inflammation: Secondary | ICD-10-CM | POA: Diagnosis not present

## 2024-10-06 DIAGNOSIS — I83891 Varicose veins of right lower extremities with other complications: Secondary | ICD-10-CM | POA: Diagnosis not present

## 2024-10-06 DIAGNOSIS — I83893 Varicose veins of bilateral lower extremities with other complications: Secondary | ICD-10-CM | POA: Diagnosis not present

## 2024-10-06 DIAGNOSIS — I8311 Varicose veins of right lower extremity with inflammation: Secondary | ICD-10-CM | POA: Diagnosis not present

## 2024-10-20 DIAGNOSIS — I83891 Varicose veins of right lower extremities with other complications: Secondary | ICD-10-CM | POA: Diagnosis not present

## 2024-10-27 ENCOUNTER — Other Ambulatory Visit (HOSPITAL_COMMUNITY): Payer: Self-pay

## 2024-10-27 DIAGNOSIS — I83892 Varicose veins of left lower extremities with other complications: Secondary | ICD-10-CM | POA: Diagnosis not present

## 2024-11-03 DIAGNOSIS — M7989 Other specified soft tissue disorders: Secondary | ICD-10-CM | POA: Diagnosis not present

## 2024-11-03 DIAGNOSIS — I83811 Varicose veins of right lower extremities with pain: Secondary | ICD-10-CM | POA: Diagnosis not present

## 2024-11-10 DIAGNOSIS — I87392 Chronic venous hypertension (idiopathic) with other complications of left lower extremity: Secondary | ICD-10-CM | POA: Diagnosis not present
